# Patient Record
Sex: Male | Born: 1980 | Race: Black or African American | Hispanic: No | Marital: Single | Smoking: Never smoker
Health system: Southern US, Community
[De-identification: ages and names within clinical notes are randomized; demographics above are authoritative.]

## PROBLEM LIST (undated history)

## (undated) DIAGNOSIS — R7303 Prediabetes: Secondary | ICD-10-CM

## (undated) DIAGNOSIS — I10 Essential (primary) hypertension: Secondary | ICD-10-CM

## (undated) DIAGNOSIS — L910 Hypertrophic scar: Secondary | ICD-10-CM

## (undated) DIAGNOSIS — E119 Type 2 diabetes mellitus without complications: Secondary | ICD-10-CM

## (undated) HISTORY — PX: KELOID EXCISION: SHX1856

---
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
## 2003-03-31 ENCOUNTER — Emergency Department (HOSPITAL_COMMUNITY): Admission: EM | Admit: 2003-03-31 | Discharge: 2003-03-31 | Payer: Self-pay | Admitting: Emergency Medicine

## 2003-11-07 ENCOUNTER — Emergency Department (HOSPITAL_COMMUNITY): Admission: EM | Admit: 2003-11-07 | Discharge: 2003-11-07 | Payer: Self-pay | Admitting: Emergency Medicine

## 2012-02-22 ENCOUNTER — Emergency Department (HOSPITAL_COMMUNITY): Payer: Self-pay

## 2012-02-22 ENCOUNTER — Encounter (HOSPITAL_COMMUNITY): Payer: Self-pay | Admitting: *Deleted

## 2012-02-22 ENCOUNTER — Emergency Department (HOSPITAL_COMMUNITY)
Admission: EM | Admit: 2012-02-22 | Discharge: 2012-02-22 | Disposition: A | Discharge status: Home / Self Care | Payer: Self-pay | Attending: Emergency Medicine | Admitting: Emergency Medicine

## 2012-02-22 DIAGNOSIS — H00019 Hordeolum externum unspecified eye, unspecified eyelid: Secondary | ICD-10-CM | POA: Insufficient documentation

## 2012-02-22 DIAGNOSIS — M549 Dorsalgia, unspecified: Secondary | ICD-10-CM | POA: Insufficient documentation

## 2012-02-22 LAB — URINALYSIS, ROUTINE W REFLEX MICROSCOPIC
Bilirubin Urine: NEGATIVE
Glucose, UA: NEGATIVE mg/dL
Hgb urine dipstick: NEGATIVE
Ketones, ur: NEGATIVE mg/dL
Leukocytes, UA: NEGATIVE
Nitrite: NEGATIVE
Protein, ur: NEGATIVE mg/dL
Specific Gravity, Urine: 1.016 (ref 1.005–1.030)
Urobilinogen, UA: 1 mg/dL (ref 0.0–1.0)
pH: 6.5 (ref 5.0–8.0)

## 2012-02-22 MED ORDER — DEXAMETHASONE SODIUM PHOSPHATE 10 MG/ML IJ SOLN
10.0000 mg | Freq: Once | INTRAMUSCULAR | Status: AC
  Administered 2012-02-22: 10 mg via INTRAMUSCULAR
  Filled 2012-02-22: qty 1

## 2012-02-22 MED ORDER — ERYTHROMYCIN 5 MG/GM OP OINT: TOPICAL_OINTMENT | OPHTHALMIC | Status: AC

## 2012-02-22 MED ORDER — HYDROCODONE-ACETAMINOPHEN 5-500 MG PO TABS: 1.0000 | ORAL_TABLET | Freq: Four times a day (QID) | ORAL | Status: AC | PRN

## 2012-02-22 MED ORDER — KETOROLAC TROMETHAMINE 60 MG/2ML IM SOLN
60.0000 mg | Freq: Once | INTRAMUSCULAR | Status: AC
  Administered 2012-02-22: 60 mg via INTRAMUSCULAR
  Filled 2012-02-22: qty 2

## 2012-02-22 MED ORDER — NAPROXEN 500 MG PO TABS: 500.0000 mg | ORAL_TABLET | Freq: Two times a day (BID) | ORAL | Status: AC

## 2012-02-22 NOTE — ED Notes (Signed)
Patient with lower back pain for last few days.  Denies any injury to area.  New job with lifting responsibilities.

## 2012-02-22 NOTE — ED Provider Notes (Signed)
History     CSN: [id number]  Arrival date & time 02/22/12  0153   First MD Initiated Contact with Patient 02/22/12 [phone]      Chief Complaint  Patient presents with  . Back Pain    (Consider location/radiation/quality/duration/timing/severity/associated sxs/prior treatment) HPI Comments: 31 year old male presents with low back pain which radiates into the bilateral hips. He states that this is gradual in onset several days ago, persistent, seems to get better when he stands up and walks and worse when he lays down. He denies any numbness or weakness of the lower extremities, no peroneal numbness, no dysuria retention or incontinence, no fevers vomiting history of IV drug use, history of cancer. He denies any history of back pain similar to this. He does do some lifting and bending at work but has been able to work for the last few days. He also denies being a diabetic, is on no home medications.  Patient is a 31 y.o. male presenting with back pain. The history is provided by the patient.  Back Pain  Pertinent negatives include no fever, no numbness and no weakness.    History reviewed. No pertinent past medical history.  History reviewed. No pertinent past surgical history.  No family history on file.  History  Substance Use Topics  . Smoking status: Not on file  . Smokeless tobacco: Not on file  . Alcohol Use: No      Review of Systems  Constitutional: Negative for fever and chills.  HENT: Negative for neck pain.   Cardiovascular: Negative for leg swelling.  Gastrointestinal: Negative for nausea and vomiting.       No incontinence of bowel  Genitourinary: Negative for difficulty urinating.       No incontinence or retention  Musculoskeletal: Positive for back pain.  Skin: Negative for rash.  Neurological: Negative for weakness and numbness.    Allergies  Review of patient's allergies indicates no known allergies.  Home Medications   Current Outpatient Rx  Name  Route Sig Dispense Refill  . IBUPROFEN 200 MG PO TABS Oral Take 400 mg by mouth every 6 (six) hours as needed. pain    . ERYTHROMYCIN 5 MG/GM OP OINT  Place a 1/2 inch ribbon of ointment into the lower eyelid every 6 hours X 1 week 1 g 1  . HYDROCODONE-ACETAMINOPHEN 5-500 MG PO TABS Oral Take 1-2 tablets by mouth every 6 (six) hours as needed for pain. 15 tablet 0  . NAPROXEN 500 MG PO TABS Oral Take 1 tablet (500 mg total) by mouth 2 (two) times daily with a meal. 30 tablet 0    BP 142/88  Pulse 62  Temp(Src) 98.1 F (36.7 C) (Oral)  Resp 18  SpO2 98%  Physical Exam  Nursing note and vitals reviewed. Constitutional: He appears well-developed and well-nourished. No distress.  HENT:  Head: Normocephalic and atraumatic.  Mouth/Throat: Oropharynx is clear and moist. No oropharyngeal exudate.  Eyes: Conjunctivae and EOM are normal. Pupils are equal, round, and reactive to light. Right eye exhibits no discharge. Left eye exhibits no discharge. No scleral icterus.       Left lower lid with a sty, non-draining, minimal redness  Neck: Normal range of motion. Neck supple. No JVD present. No thyromegaly present.  Cardiovascular: Normal rate, regular rhythm, normal heart sounds and intact distal pulses.  Exam reveals no gallop and no friction rub.   No murmur heard. Pulmonary/Chest: Effort normal and breath sounds normal. No respiratory distress. He has no  wheezes. He has no rales.  Abdominal: Soft. Bowel sounds are normal. He exhibits no distension and no mass. There is no tenderness.  Musculoskeletal: Normal range of motion. He exhibits tenderness ( Tenderness to the midline lumbar spine, no paraspinal tenderness). He exhibits no edema.  Lymphadenopathy:    He has no cervical adenopathy.  Neurological: He is alert. Coordination normal.       Normal gait, normal sensation and motor to light touch and pinprick of the bilateral lower extremities, has positive straight leg raise test bilaterally.  Pain relieved with flexion of the hip and knee, normal patellar reflexes bilaterally. Normal perineal sensation  Skin: Skin is warm and dry. No rash noted. No erythema.  Psychiatric: He has a normal mood and affect. His behavior is normal.    ED Course  Procedures (including critical care time)   Labs Reviewed  URINALYSIS, ROUTINE W REFLEX MICROSCOPIC   Dg Lumbar Spine Complete  02/22/2012  *RADIOLOGY REPORT*  Clinical Data: Chronic back pain radiating to both legs.  No known injury.  LUMBAR SPINE - COMPLETE 4+ VIEW  Comparison: None.  Findings: Five lumbar type vertebrae.  Normal alignment of the lumbar vertebrae and facet joints.  No vertebral compression deformities.  Intervertebral disc space heights are preserved.  No focal bone lesion or bone destruction.  Bone cortex and trabecular architecture appear intact.  IMPRESSION: No displaced fractures identified.  Original Report Authenticated By: Marlon Pel, M.D.     1. Back pain   2. Stye       MDM  Neurologic exam reassuring, has tenderness in the lumbar spine, has no lesion into the legs beyond the upper anterior thighs, lumbar spine x-rays appear normal, pain medications including Toradol and Decadron ordered. Doubt neurologic emergency including epidural hematoma or abscess, mass or fracture.  Patient has negative x-ray, improved with Toradol and Decadron intramuscular, sty of the left eye treated with erythromycin ointment as an outpatient, resource list given for followup.  Discharge Prescriptions include:  #1 Naprosyn #2 hydrocodone #3 erythromycin ophthalmic ointment       Vida Roller, MD 02/22/12 [phone]

## 2012-02-22 NOTE — ED Notes (Signed)
Pt is here for pain in lower back and hips which radiates into his legs.  Not associated with trauma but notes that he is required to do some lifting for his job.  No incontinence with this

## 2012-02-22 NOTE — Discharge Instructions (Signed)
Take Naprosyn twice a day, hydrocodone for severe pain, use the erythromycin ointment for your eye as prescribed. See the list below for followup phone numbers  Your back pain should be treated with medicines such as ibuprofen or aleve and this back pain should get better over the next 2 weeks.  However if you develop severe or worsening pain, low back pain with fever, numbness, weakness or inability to walk or urinate, you should return to the ER immediately.  Please follow up with your doctor this week for a recheck if still having symptoms.  Your x-ray was negative for fractures or dislocations of her spine  RESOURCE GUIDE  Dental Problems  Patients with Medicaid: [facility] [phone]                                           [phone] [address] [employer] [address] Phone:  [phone]                                                  Phone:  [phone]  If unable to pay or uninsured, contact:  [facility] or [facility]. to become qualified for the adult dental clinic.  Chronic Pain Problems Contact [facility]  [phone] Patients need to be referred by their primary care doctor.  Insufficient Money for Medicine Contact [employer]:  call "211" or [facility] 5100614106.  No Primary Care Doctor Call [facility]  [phone] Other agencies that provide inexpensive medical care    [facility]  [phone]    [facility]  [phone]    [facility]  [phone]    [facility]  [phone]    [facility]  [phone]    [facility]  [phone]  Psychological Services [facility]  [phone] [facility] Services  [phone] [facility]   [phone] (emergency services [phone])  Substance Abuse Resources [facility]  3802168638 [facility] [phone] [facility] [phone] Floydene Flock  [phone] Residential & Outpatient Substance Abuse Program  [phone]  Abuse/Neglect [facility] Child Abuse Hotline (567)414-5187 [facility] Child Abuse Hotline (320)520-8377 (After Hours)  Emergency Shelter [facility] 367-831-1817  Maternity Homes Room at the [place] [facility] [place] [phone] [phone] [facility] 6234565892  MRSA Hotline #:   534-136-1188    [facility] Resources  [facility]     [employer]                          [facility] Dept. [address] [place]                       [address]      [address]  [address]  Sela Hua Phone:  [phone]                                   Phone:  [phone]                 Phone:  [place] Phone:  [place] [phone] (After Hours)

## 2014-01-10 ENCOUNTER — Ambulatory Visit (INDEPENDENT_AMBULATORY_CARE_PROVIDER_SITE_OTHER): Payer: No Typology Code available for payment source | Admitting: Family Medicine

## 2014-01-10 VITALS — BP 120/80 | HR 70 | Temp 97.6°F | Resp 16 | Ht 69.0 in | Wt 259.0 lb

## 2014-01-10 DIAGNOSIS — R1310 Dysphagia, unspecified: Secondary | ICD-10-CM

## 2014-01-10 DIAGNOSIS — L910 Hypertrophic scar: Secondary | ICD-10-CM

## 2014-01-10 MED ORDER — OMEPRAZOLE 20 MG PO CPDR: 20.0000 mg | DELAYED_RELEASE_CAPSULE | Freq: Every day | ORAL | Status: DC

## 2014-01-10 NOTE — Progress Notes (Signed)
   Subjective:    Patient ID: Paul Frey, male    DOB: [birth year], 33 y.o.   MRN: [id number]  HPI Has had the sensation of food being stuck in his throat for 1 month. This is increasing in frequency. Feels like there is always something stuck inside the left side of his throat, this feels worse with eating and drinking. Liquids becoming more problematic, irritating area and causing gagging. Food does not get "stuck" but irritates area of throat. Has vomited phlegm during an episode following eating, but has never vomited food due to this. Had coughing episode last night, EMS was called. Was told his blood pressure was elevated. No weight loss. Feels afraid to eat. Has tried eating soft foods. No recent or past heart burn or acid reflux.  Has two keloids on left neck/jaw area that have been removed about 15 years ago and grew back. He is interested in having this removed again. No surgeon preference.  Does not have a primary care provider. [facility].    Review of Systems No chest pain, no fever, no shortness of breath. No headache, neck pain or facial pain. No constipation or diarrhea.    Objective:   Physical Exam  Vitals reviewed. Constitutional: He is oriented to person, place, and time. He appears well-developed and well-nourished. No distress.  HENT:  Head: Normocephalic and atraumatic.  Right Ear: External ear normal.  Left Ear: External ear normal.  Mouth/Throat: Oropharynx is clear and moist.  Eyes: Conjunctivae are normal.  Neck: Normal range of motion. Neck supple.  Cardiovascular: Normal rate, regular rhythm and normal heart sounds.   Pulmonary/Chest: Effort normal and breath sounds normal.  Lymphadenopathy:    He has no cervical adenopathy.  Neurological: He is alert and oriented to person, place, and time.  Skin: Skin is warm and dry. He is not diaphoretic.  Two large 3-4 cm keloids on left lateral lower jaw.  Psychiatric: He  has a normal mood and affect. His behavior is normal. Judgment and thought content normal.   Patient was periodically swallowing deliberately and occasionally coughing.    Assessment & Plan:  1. Swallowing difficulty - DG Esophagus; Future - omeprazole (PRILOSEC) 20 MG capsule; Take 1 capsule (20 mg total) by mouth daily.  Dispense: 30 capsule; Refill: 3 -Encouraged him to start prilosec tonight, can take OTC Zantac for more immediate action -If worsening symptoms, encouraged patient to go to ER 2. Keloid of skin - Ambulatory referral to General Surgery  3- [facility]  Emi Belfasteborah B. Maurio Baize, FNP-BC  [facility]  01/10/2014 6:11 PM

## 2014-01-10 NOTE — Progress Notes (Signed)
I have discussed this case with Ms. Gessner, NP and agree.  

## 2014-01-13 ENCOUNTER — Encounter (HOSPITAL_COMMUNITY): Payer: Self-pay | Admitting: Emergency Medicine

## 2014-01-13 ENCOUNTER — Emergency Department (HOSPITAL_COMMUNITY)
Admission: EM | Admit: 2014-01-13 | Discharge: 2014-01-13 | Disposition: A | Discharge status: Home / Self Care | Payer: 59 | Attending: Emergency Medicine | Admitting: Emergency Medicine

## 2014-01-13 DIAGNOSIS — R070 Pain in throat: Secondary | ICD-10-CM | POA: Insufficient documentation

## 2014-01-13 DIAGNOSIS — J3489 Other specified disorders of nose and nasal sinuses: Secondary | ICD-10-CM | POA: Insufficient documentation

## 2014-01-13 DIAGNOSIS — R6889 Other general symptoms and signs: Secondary | ICD-10-CM | POA: Insufficient documentation

## 2014-01-13 DIAGNOSIS — R1310 Dysphagia, unspecified: Secondary | ICD-10-CM | POA: Insufficient documentation

## 2014-01-13 MED ORDER — MAGIC MOUTHWASH W/LIDOCAINE: 5.0000 mL | Freq: Four times a day (QID) | ORAL | Status: DC | PRN

## 2014-01-13 MED ORDER — GI COCKTAIL ~~LOC~~
30.0000 mL | Freq: Once | ORAL | Status: AC
  Administered 2014-01-13: 30 mL via ORAL
  Filled 2014-01-13: qty 30

## 2014-01-13 NOTE — ED Provider Notes (Signed)
CSN: [id number]     Arrival date & time 01/13/14  1848 History   First MD Initiated Contact with Patient 01/13/14 2038     Chief Complaint  Patient presents with  . Sore Throat     (Consider location/radiation/quality/duration/timing/severity/associated sxs/prior Treatment) HPI History provided by pt.   For the past week, patient has had the sensation of a ball of mucous deep in his throat that causes him to choke ~5-10 minutes after taking anything by mouth, particularly carbonated fluids.  Associated w/ constant lower throat pain and mild sinus pressure.  Denies fever, nasal congestion, rhinorrhea, CP, SOB and cough.  Has never had these sx in the past.  No known h/o acid reflux but he is suspicious that this is the etiology of his sx.    History reviewed. No pertinent past medical history. History reviewed. No pertinent past surgical history. No family history on file. History  Substance Use Topics  . Smoking status: Never Smoker   . Smokeless tobacco: Not on file  . Alcohol Use: No    Review of Systems  All other systems reviewed and are negative.      Allergies  Review of patient's allergies indicates no known allergies.  Home Medications  No current outpatient prescriptions on file. BP 117/87  Pulse 77  Temp(Src) 98.8 F (37.1 C) (Oral)  Resp 14  Ht 5\' 9"  (1.753 m)  Wt 249 lb (112.946 kg)  BMI 36.75 kg/m2  SpO2 97% Physical Exam  Nursing note and vitals reviewed. Constitutional: He is oriented to person, place, and time. He appears well-developed and well-nourished. No distress.  HENT:  Head: Normocephalic and atraumatic.  Eyes:  Normal appearance  Neck: Normal range of motion. Neck supple. No tracheal deviation present. No thyromegaly present.  Tenderness just L of trachea.  No masses. Able to swallow.   Cardiovascular: Normal rate and regular rhythm.   Pulmonary/Chest: Effort normal and breath sounds normal. No respiratory distress.  Musculoskeletal:  Normal range of motion.  Lymphadenopathy:    He has no cervical adenopathy.  Neurological: He is alert and oriented to person, place, and time.  Skin: Skin is warm and dry. No rash noted.  Psychiatric: He has a normal mood and affect. His behavior is normal.    ED Course  Procedures (including critical care time) Labs Review Labs Reviewed - No data to display Imaging Review No results found.   EKG Interpretation None      MDM   Final diagnoses:  Dysphagia  Odynophagia    Healthy 33yo M presents w/ throat pain and post-prandial choking x 1 week.  Choking occurs ~5-10 min following po intake, and is worst w/ carbonated fluids.  Normal neck exam w/ exception of tenderness just L of trachea.  GI cocktail ordered for pain.  I have some suspicion for acid reflux.  Will reassess shortly.  9:08 PM   Pain much improved w/ GI cocktail.  Pt drank a can of sprite w/out difficulty.  I recommended a two week trial of protonix and prescribed prn magic mouthwash as well.  Also recommended that he try sudafed in case he has post-nasal drip.  Advised return for worsening sx and referred to ENT for persistent sx.   Otilio MiuCatherine E Bertis Hustead, PA-C 01/14/14 2034

## 2014-01-13 NOTE — ED Notes (Signed)
Patient presents stating that since Thursday he has felt like there has been something stuck in his epigastric area.  States he can swallow and it takes about 5 minutes for him to get everything down.  States he has tried some carbonated soda and it makes him feels like he is going to immediately spit it out.

## 2014-01-13 NOTE — ED Notes (Signed)
Pt. reports sore throat / swelling " hard to swallow " with tenacious mucus for several days , airway intact / respirations unlabored .

## 2014-01-13 NOTE — ED Notes (Signed)
Throat does not appear to be swollen

## 2014-01-13 NOTE — Discharge Instructions (Signed)
Take prilosec (omeprazole) once a day for the next two weeks.  You can use the magic mouthwash as needed for throat pain.  You can try sudafed as well.  Stick to soft fluids and minimize carbonated beverages. Please return to the ER if your symptoms worsen or you have not had any improvement in 3-5 days.

## 2014-01-15 ENCOUNTER — Telehealth: Payer: Self-pay | Admitting: General Practice

## 2014-01-15 NOTE — Telephone Encounter (Signed)
Called and left vm for patient to give us a call bk to establish primary care/ CPE With any provider that's accepting new patients

## 2014-01-17 NOTE — ED Provider Notes (Signed)
Medical screening examination/treatment/procedure(s) were performed by non-physician practitioner and as supervising physician I was immediately available for consultation/collaboration.   EKG Interpretation None        Junius ArgyleForrest S Drago Hammonds, MD 01/17/14 1218

## 2014-03-07 ENCOUNTER — Encounter (HOSPITAL_COMMUNITY): Payer: Self-pay | Admitting: Emergency Medicine

## 2014-03-07 ENCOUNTER — Emergency Department (HOSPITAL_COMMUNITY)
Admission: EM | Admit: 2014-03-07 | Discharge: 2014-03-07 | Disposition: A | Discharge status: Home / Self Care | Payer: 59 | Attending: Emergency Medicine | Admitting: Emergency Medicine

## 2014-03-07 ENCOUNTER — Emergency Department (HOSPITAL_COMMUNITY): Payer: 59

## 2014-03-07 DIAGNOSIS — X500XXA Overexertion from strenuous movement or load, initial encounter: Secondary | ICD-10-CM | POA: Insufficient documentation

## 2014-03-07 DIAGNOSIS — Y9389 Activity, other specified: Secondary | ICD-10-CM | POA: Insufficient documentation

## 2014-03-07 DIAGNOSIS — Y9289 Other specified places as the place of occurrence of the external cause: Secondary | ICD-10-CM | POA: Insufficient documentation

## 2014-03-07 DIAGNOSIS — H00019 Hordeolum externum unspecified eye, unspecified eyelid: Secondary | ICD-10-CM | POA: Insufficient documentation

## 2014-03-07 DIAGNOSIS — S335XXA Sprain of ligaments of lumbar spine, initial encounter: Secondary | ICD-10-CM | POA: Insufficient documentation

## 2014-03-07 DIAGNOSIS — S39012A Strain of muscle, fascia and tendon of lower back, initial encounter: Secondary | ICD-10-CM

## 2014-03-07 DIAGNOSIS — M545 Low back pain, unspecified: Secondary | ICD-10-CM

## 2014-03-07 MED ORDER — NAPROXEN 500 MG PO TABS: 500.0000 mg | ORAL_TABLET | Freq: Two times a day (BID) | ORAL | Status: DC

## 2014-03-07 MED ORDER — HYDROMORPHONE HCL PF 1 MG/ML IJ SOLN
1.0000 mg | Freq: Once | INTRAMUSCULAR | Status: AC
  Administered 2014-03-07: 1 mg via INTRAVENOUS
  Filled 2014-03-07: qty 1

## 2014-03-07 MED ORDER — DIAZEPAM 5 MG/ML IJ SOLN
5.0000 mg | Freq: Once | INTRAMUSCULAR | Status: AC
  Administered 2014-03-07: 5 mg via INTRAMUSCULAR
  Filled 2014-03-07: qty 2

## 2014-03-07 MED ORDER — METHOCARBAMOL 500 MG PO TABS: 500.0000 mg | ORAL_TABLET | Freq: Two times a day (BID) | ORAL | Status: DC

## 2014-03-07 MED ORDER — HYDROCODONE-ACETAMINOPHEN 5-325 MG PO TABS: 1.0000 | ORAL_TABLET | ORAL | Status: DC | PRN

## 2014-03-07 NOTE — ED Notes (Signed)
Patient transported to X-ray 

## 2014-03-07 NOTE — ED Notes (Signed)
Pt reports lower back pain x 3 days. States "I have been lifting weights and my back has been hurting." Pt denies urinary/bowel incontinence. Pt is ambulatory but with difficulty dt pain.

## 2014-03-07 NOTE — ED Provider Notes (Signed)
CSN: [id number]     Arrival date & time 03/07/14  1824 This chart was scribed for non-physician practitioner, Dierdre ForthHannah Acheron Sugg, PA-C, working with Doug SouSam Jacubowitz, MD by Charline BillsEssence Howell, ED Scribe. This patient was seen in room TR07C/TR07C and the patient's care was started at 7:22 PM.    Chief Complaint  Patient presents with  . Back Pain   The history is provided by the patient and medical records. No language interpreter was used.   HPI Comments: Paul Frey is a 33 y.o. male who presents to the Emergency Department complaining of sharp lower back pain that radiates to bilateral legs onset 03/05/14. Pt states that he fell asleep in his car 2 days ago and noted pain upon waking. He initially thought the pain was due to the position he slept in. Pt also states that he went to the bathroom this morning, heard a "pop" and was unable to stand due to severity of pain afterwards, but had no numbness or tingling in his legs at that time. He rates his pain 9/10 with movement.  He denies numbness in his legs, feet or groin, he also denies bowel or bladder incontinence. Pain is worsened with walking and sitting back and eased with sitting up and laying on his side. He denies fall or injury. He has taken Advil with no relief. Pt was seen here before for similar episode. No known back problems. No h/o back surgery.  History reviewed. No pertinent past medical history. History reviewed. No pertinent past surgical history. History reviewed. No pertinent family history. History  Substance Use Topics  . Smoking status: Never Smoker   . Smokeless tobacco: Not on file  . Alcohol Use: No    Review of Systems  Constitutional: Negative for fever and fatigue.  Respiratory: Negative for chest tightness and shortness of breath.   Cardiovascular: Negative for chest pain.  Gastrointestinal: Negative for nausea, vomiting, abdominal pain and diarrhea.  Genitourinary: Negative for dysuria, urgency, frequency and  hematuria.  Musculoskeletal: Positive for back pain and gait problem ( 2/2 pain). Negative for joint swelling, neck pain and neck stiffness.  Skin: Negative for rash.  Neurological: Negative for weakness, light-headedness, numbness and headaches.  All other systems reviewed and are negative.   Allergies  Review of patient's allergies indicates no known allergies.  Home Medications   Prior to Admission medications   Medication Sig Start Date End Date Taking? Authorizing Provider  Alum & Mag Hydroxide-Simeth (MAGIC MOUTHWASH W/LIDOCAINE) SOLN Take 5 mLs by mouth 4 (four) times daily as needed for mouth pain. 01/13/14   Arie Sabinaatherine E Schinlever, PA-C   Triage Vitals: BP 138/88  Pulse 68  Temp(Src) 98 F (36.7 C) (Oral)  Resp 18  SpO2 100% Physical Exam  Nursing note and vitals reviewed. Constitutional: He is oriented to person, place, and time. He appears well-developed and well-nourished. No distress.  HENT:  Head: Normocephalic and atraumatic.  Mouth/Throat: Oropharynx is clear and moist. No oropharyngeal exudate.  Eyes: Conjunctivae are normal.  Left lower eye lid with external hordeolum - pt reports this is chronic and has been present for many years  Neck: Normal range of motion. Neck supple.  Full ROM without pain  Cardiovascular: Normal rate, regular rhythm, normal heart sounds and intact distal pulses.   No murmur heard. Pulmonary/Chest: Effort normal and breath sounds normal. No respiratory distress. He has no wheezes.  Abdominal: Soft. Bowel sounds are normal. He exhibits no distension. There is no tenderness.  No masses or tenderness  Musculoskeletal:  Decreased range of motion of the L-spine with normal range of motion of the T-spine No tenderness to palpation of the spinous processes of the T-spine or L-spine Mild tenderness to palpation of the bilateral paraspinous muscles of the L-spine No midline tenderness to palpation, but pt indicates this is the source of his  pain Positive bilateral straight leg raise  Lymphadenopathy:    He has no cervical adenopathy.  Neurological: He is alert and oriented to person, place, and time. He has normal reflexes. He exhibits normal muscle tone. Coordination normal.  Speech is clear and goal oriented, follows commands Normal 5/5 strength in upper and lower extremities bilaterally including dorsiflexion and plantar flexion, strong and equal grip strength Sensation normal to light and sharp touch Moves extremities without ataxia, coordination intact Patient unwilling to ambulate due to pain  Skin: Skin is warm and dry. No rash noted. He is not diaphoretic. No erythema.  Psychiatric: He has a normal mood and affect. His behavior is normal.    ED Course  Procedures (including critical care time) DIAGNOSTIC STUDIES: Oxygen Saturation is 100% on RA, normal by my interpretation.    COORDINATION OF CARE: 7:27 PM Discussed treatment plan with pt at bedside and pt agreed to plan.  Labs Review  Labs Reviewed - No data to display  Imaging Review Dg Lumbar Spine Complete  03/07/2014   CLINICAL DATA:  Popping sensation and severe low back pain.  EXAM: LUMBAR SPINE - COMPLETE 4+ VIEW  COMPARISON:  DG LUMBAR SPINE COMPLETE dated 02/22/2012  FINDINGS: Interval progression of L4-L5 degenerative disease with slightly more disc space loss. Grade I retrolisthesis of L4 on L5 measures 4 mm, little change from prior. Mild disc space loss is also present at L3-L4. The L5-S1 disc appears normal. Mild levoconvex curve may be positional or due to spasm.  IMPRESSION: Mild progression of L4-L5 degenerative disc disease with grade I retrolisthesis.   Electronically Signed   By: Andreas Newport M.D.   On: 03/07/2014 20:49     EKG Interpretation None      No red flag s/s of low back pain. Patient was counseled on back pain precautions and told to do activity as tolerated but do not lift, push, or pull heavy objects more than 10 pounds for  the next week. Patient counseled to use ice or heat on back for no longer than 15 minutes every hour.   Patient prescribed muscle relaxer and counseled on proper use of muscle relaxant medication.    Patient prescribed narcotic pain medicine and counseled on proper use of narcotic pain medications. Told he can increase to every 4 hrs if needed while pain is worse. Counseled not to combine this medication with others containing tylenol.   Urged patient not to drink alcohol, drive, or perform any other activities that requires focus while taking either of these medications.   Patient urged to follow-up with PCP if pain does not improve with treatment and rest or if pain becomes recurrent. Urged to return with worsening severe pain, loss of bowel or bladder control, trouble walking.   The patient verbalizes understanding and agrees with the plan.   MDM   Final diagnoses:  Low back pain  Lumbar strain  Hordeolum externum (stye)   Paul Frey presents with nontraumatic back pain.  Patient with back pain.  No neurological deficits and normal neuro exam.  Pt reports midline pain though this is not elicited on exam.  Will x-ray and give pain  control.    8:29 PM Pt pain improved with medications. He ambulates without difficulty and completes the neuro exam with some pain but without focal deficit or red flag for back pain.    Patient can walk but states is painful.  No loss of bowel or bladder control.  No concern for cauda equina.  No fever, night sweats, weight loss, h/o cancer, IVDU.  RICE protocol and pain medicine indicated and discussed with patient.  Patient is going to use the resource guide to find a primary care physician for which he will followup in one week.  He's been told to return to the emergency department for gait disturbance, loss of bowel or bladder control or other concerning symptoms.  It has been determined that no acute conditions requiring further emergency intervention  are present at this time. The patient/guardian have been advised of the diagnosis and plan. We have discussed signs and symptoms that warrant return to the ED, such as changes or worsening in symptoms.   Vital signs are stable at discharge.   BP 138/88  Pulse 68  Temp(Src) 98 F (36.7 C) (Oral)  Resp 18  SpO2 100%  Patient/guardian has voiced understanding and agreed to follow-up with the PCP or specialist.  I personally performed the services described in this documentation, which was scribed in my presence. The recorded information has been reviewed and is accurate.    Dahlia ClientHannah Reagen Goates, PA-C 03/07/14 2135

## 2014-03-07 NOTE — Discharge Instructions (Signed)
1. Medications: robaxin, naproxyn, vicodin, usual home medications 2. Treatment: rest, drink plenty of fluids, gentle stretching as discussed, alternate ice and heat 3. Follow Up: Please followup with your primary doctor for discussion of your diagnoses and further evaluation after today's visit; if you do not have a primary care doctor use the resource guide provided to find one;    Back Exercises Back exercises help treat and prevent back injuries. The goal of back exercises is to increase the strength of your abdominal and back muscles and the flexibility of your back. These exercises should be started when you no longer have back pain. Back exercises include:  Pelvic Tilt. Lie on your back with your knees bent. Tilt your pelvis until the lower part of your back is against the floor. Hold this position 5 to 10 sec and repeat 5 to 10 times.  Knee to Chest. Pull first 1 knee up against your chest and hold for 20 to 30 seconds, repeat this with the other knee, and then both knees. This may be done with the other leg straight or bent, whichever feels better.  Sit-Ups or Curl-Ups. Bend your knees 90 degrees. Start with tilting your pelvis, and do a partial, slow sit-up, lifting your trunk only 30 to 45 degrees off the floor. Take at least 2 to 3 seconds for each sit-up. Do not do sit-ups with your knees out straight. If partial sit-ups are difficult, simply do the above but with only tightening your abdominal muscles and holding it as directed.  Hip-Lift. Lie on your back with your knees flexed 90 degrees. Push down with your feet and shoulders as you raise your hips a couple inches off the floor; hold for 10 seconds, repeat 5 to 10 times.  Back arches. Lie on your stomach, propping yourself up on bent elbows. Slowly press on your hands, causing an arch in your low back. Repeat 3 to 5 times. Any initial stiffness and discomfort should lessen with repetition over time.  Shoulder-Lifts. Lie face down  with arms beside your body. Keep hips and torso pressed to floor as you slowly lift your head and shoulders off the floor. Do not overdo your exercises, especially in the beginning. Exercises may cause you some mild back discomfort which lasts for a few minutes; however, if the pain is more severe, or lasts for more than 15 minutes, do not continue exercises until you see your caregiver. Improvement with exercise therapy for back problems is slow.  See your caregivers for assistance with developing a proper back exercise program. Document Released: 11/24/2004 Document Revised: 01/09/2012 Document Reviewed: 08/18/2011 [facility] Patient Information 2014 [place], [place].    Emergency Department Resource Guide 1) Find a Doctor and Pay Out of Pocket Although you won't have to find out who is covered by your insurance plan, it is a good idea to ask around and get recommendations. You will then need to call the office and see if the doctor you have chosen will accept you as a new patient and what types of options they offer for patients who are self-pay. Some doctors offer discounts or will set up payment plans for their patients who do not have insurance, but you will need to ask so you aren't surprised when you get to your appointment.  2) Contact Your Local Health Department Not all health departments have doctors that can see patients for sick visits, but many do, so it is worth a call to see if yours does. If you  don't know where your local health department is, you can check in your phone book. The CDC also has a tool to help you locate your state's health department, and many state websites also have listings of all of their local health departments.  3) Find a [facility] If your illness is not likely to be very severe or complicated, you may want to try a walk in clinic. These are popping up all over the country in pharmacies, drugstores, and shopping centers. They're usually staffed by nurse  practitioners or physician assistants that have been trained to treat common illnesses and complaints. They're usually fairly quick and inexpensive. However, if you have serious medical issues or chronic medical problems, these are probably not your best option.  No Primary Care Doctor: - Call Health Connect at  [phone] - they can help you locate a primary care doctor that  accepts your insurance, provides certain services, etc. - Physician Referral Service- [phone]  Chronic Pain Problems: Organization         Address  Phone   Notes  [facility]  [phone] Patients need to be referred by their primary care doctor.   Medication Assistance: Organization         Address  Phone   Notes  [facility] Medication [facility] [address] [place], [place] [address] [phone] --Must be a resident of [facility] -- Must have NO insurance coverage whatsoever (no Medicaid/ Medicare, etc.) -- The pt. MUST have a primary care doctor that directs their care regularly and follows them in the community   MedAssist  [phone]   [employer]  [phone]    Agencies that provide inexpensive medical care: Organization         Address  Phone   Notes  [place]  [phone]   [facility]    [phone]   [facility] [place], [place] [address] [phone]   [place] [address] [place] [phone]   Planned Parenthood    ([phone]   [facility]    [phone]   [place] and [place], [place] Phone:  [phone], Fax:  [phone] Hours of Operation:  9 am - 6 pm, M-F.  Also accepts Medicaid/Medicare and self-pay.  [facility] for [place], [address], [place] Phone: [phone], Fax: [phone]. Hours of Operation:  8:30 am -  5:30 pm, M-F.  Also accepts Medicaid and self-pay.  [facility] [place] [address], [place] Phone: [phone]   [place], [place], [place] [phone], Ext. [phone] Mondays & Thursdays: 7-9 AM.  First 15 patients are seen on a first come, first serve basis.    [place] Providers:  Organization         Address  Phone   Notes  [facility] [address], [place] [phone] Also accepts self-pay patients.  [place], [place]  [phone]   [place], [address], [place] [phone]   [facility] [address] [place] [phone]   Lucianne Lei [address], [place]   [phone] Only accepts [place]  Access Medicaid patients after they have their name applied to their card.   Self-Pay (no insurance) in [facility]:  Organization         Address  Phone   Notes  Sickle Cell Patients, [facility] [place] [phone]   [facility] [phone]   [facility] [place]  [place], [place], [place] [phone]   [facility]/Dr. Osei-Bonsu  [address], [place] or [address], [place] [phone] Phone number for both [place] and [place] locations is the same.  Urgent Medical and [facility] [address] [place] [phone]   [facility] [address] [place] or [address] [phone]   [facility] [address] [place] [phone], phone; [phone], fax Sees patients 1st and 3rd Saturday of every month.  Must not qualify for public or private insurance (i.e. Medicaid, Medicare, [place] Health Choice,  Veterans' Benefits)  Household income should be no more than 200% of the poverty level The clinic cannot treat you if you are pregnant or think you are pregnant  Sexually transmitted diseases are not treated at the clinic.    Dental Care: Organization         Address  Phone  Notes  [facility] Department of [facility] [place] [phone] Accepts children up to age 67 who are enrolled in [place] or [place]; pregnant women with a Medicaid card; and children who have applied for Medicaid or  Health Choice, but were declined, whose parents can pay a reduced fee at time of service.  [facility] Department of [facility]  [address], [place] [phone] Accepts children up to age 65 who are enrolled in [place] or [place]; pregnant women with a Medicaid card; and children who have applied for Medicaid or  Health Choice, but were declined, whose parents can pay a reduced fee at time of service.  [place] [facility] Adult Dental Access PROGRAM  [place] [phone] Patients are seen by appointment only. Walk-ins are not accepted. [place] will see patients 43 years of age and older. Monday - Tuesday (8am-5pm) Most Wednesdays (8:30-5pm) $30 per visit, cash only  [facility] Adult Dental Access PROGRAM  [address], [facility] [phone] Patients are seen by appointment only. Walk-ins are not accepted. [place] will see patients 42 years of age and older. One Wednesday Evening (Monthly: Volunteer Based).  $30 per visit, cash only  [place]  [phone] for adults; Children under age 83, call Graduate Pediatric Dentistry at [phone]. Children aged 65-14, please call [phone] to request a pediatric application.  Dental services are provided in all areas of dental care including fillings, crowns and bridges, complete and  partial dentures, implants, gum treatment, root canals, and extractions. Preventive care is also provided. Treatment is provided to both adults and children. Patients are selected via a lottery and there is often a waiting list.   [facility] [address] [place]  [phone] [email or web address], [place], [place] [phone], Ext. 123 Second and Fourth Thursday of each month, opens at 6:30 AM; Clinic ends at 9  AM.  Patients are seen on a first-come first-served basis, and a limited number are seen during each clinic.   [facility]  [address] Hillard Danker [place], [place] [phone]   Eligibility Requirements You must have lived in [place], [place], or [place] counties for at least the last three months.   You cannot be eligible for state or federal sponsored [employer], including [employer], [place], or [employer].   You generally cannot be eligible for healthcare insurance through your employer.    How to apply: Eligibility screenings are held every Tuesday and Wednesday afternoon from 1:00 pm until 4:00 pm. You do not need an appointment for the interview!  [facility] [address] [place], [place]   [place]  [place] Department  [place]  [phone]    Behavioral Health Resources in the Community: Intensive Outpatient Programs Organization         Address  Phone  Notes  [place]. [address], [place], [place] [phone]   [facility] Outpatient [address] [place], [place]   ADS: Alcohol & Drug Svcs [address] [place], [place]   [place] [address],  [place], [place] or [phone]   Substance Abuse Resources Organization          Address  Phone  Notes  Alcohol and Drug Services  [phone]   [place]  [phone]   [facility]   Chinita Pester  [phone]   Residential & Outpatient Substance Abuse Program  [phone]   Psychological Services Organization         Address  Phone  Notes  [facility] [place]  [place]  [phone]   [place] [address], [place] or [phone]    Mobile Crisis Teams Organization         Address  Phone  Notes  [facility], Mobile Crisis Care Unit  [phone]   [facility]ive Psychotherapeutic Services  [address] [place], [place]   [place] [address] [place] [phone]    Self-Help/Support Groups Organization         Address  Phone             Notes  [place]. of [place] - variety of support groups  [place] Call for more information  Narcotics Anonymous (NA), [facility] [address], [employer] [place]  2 meetings at this location   Special educational needs teacher         Address  Phone  Notes  ASAP Residential Treatment [place],    [place]  [phone]   [facility]  [address], [place] [phone], [place], [place]   [place], [place] [phone] Admissions: 8am-3pm M-F  Incentives Substance [place] [address],    [place], [place] [phone]   [facility] [address] Jadene Pierini [place], [place]   [facility] [address],  [place], [place]   [facility] Programs - Intensive Outpatient [address] Kristeen [address], [place], [place]   [facility] ([place].,  [place], [place] or [phone]   Residential Treatment Services (RTS)  [address] [place], [place] Accepts Medicaid  [facility] [place] [address],  [place] [phone] Substance Abuse/Addiction Treatment   [facility] Organization         Address  Phone  Notes  [facility]  [phone]   Domenic Schwab, PhD [address] Arlis Porta [place], [place]   [phone] or [phone]   [place], [place] [phone]   [address], [place], [place] [phone] Insurance/Medicaid/sponsorship through [facility] and Families [address], [address] [place]                                    [place], [place] [phone] [place] [address], [place] [phone]    Dr. Adele Schilder  [phone]   [facility] Dept. 1) [address] [place] 2) [place] 3)  [place] [address], [place] [phone]  [phone]   [place] [phone] or [phone] (After Hours)

## 2014-03-07 NOTE — ED Notes (Signed)
Pt reports lower back pain that started on Wednesday. Reports that he took OTC medication without relief.

## 2014-03-08 NOTE — ED Provider Notes (Signed)
Medical screening examination/treatment/procedure(s) were performed by non-physician practitioner and as supervising physician I was immediately available for consultation/collaboration.   EKG Interpretation None       Georgiana Spillane, MD 03/08/14 0053 

## 2015-01-30 ENCOUNTER — Ambulatory Visit (HOSPITAL_COMMUNITY)
Admission: RE | Admit: 2015-01-30 | Discharge: 2015-01-30 | Disposition: A | Discharge status: Home / Self Care | Payer: PRIVATE HEALTH INSURANCE | Source: Ambulatory Visit | Attending: Family Medicine | Admitting: Family Medicine

## 2015-01-30 DIAGNOSIS — R1310 Dysphagia, unspecified: Secondary | ICD-10-CM | POA: Diagnosis present

## 2015-04-24 ENCOUNTER — Emergency Department (HOSPITAL_COMMUNITY)
Admission: EM | Admit: 2015-04-24 | Discharge: 2015-04-24 | Disposition: A | Discharge status: Home / Self Care | Payer: PRIVATE HEALTH INSURANCE | Attending: Emergency Medicine | Admitting: Emergency Medicine

## 2015-04-24 ENCOUNTER — Encounter (HOSPITAL_COMMUNITY): Payer: Self-pay

## 2015-04-24 DIAGNOSIS — Z791 Long term (current) use of non-steroidal anti-inflammatories (NSAID): Secondary | ICD-10-CM | POA: Diagnosis not present

## 2015-04-24 DIAGNOSIS — Z79899 Other long term (current) drug therapy: Secondary | ICD-10-CM | POA: Diagnosis not present

## 2015-04-24 DIAGNOSIS — L910 Hypertrophic scar: Secondary | ICD-10-CM | POA: Diagnosis not present

## 2015-04-24 HISTORY — DX: Hypertrophic scar: L91.0

## 2015-04-24 NOTE — Discharge Instructions (Signed)
Take your antibiotics as directed until gone.

## 2015-04-24 NOTE — ED Provider Notes (Signed)
CSN: [id number]     Arrival date & time 04/24/15  4098 History   First MD Initiated Contact with Patient 04/24/15 0357     Chief Complaint  Patient presents with  . Keloid     (Consider location/radiation/quality/duration/timing/severity/associated sxs/prior Treatment) HPI Comments: Patient is a 34 year old male who present with a keloid on his left face since 2005. Patient reports over the past 2 days, the area has become painful. The pain is throbbing and severe without radiation. There is associated erythema and edema. Patient reports going to urgent care yesterday where the keloid was drained and he was started on antibiotics. Patient has not started taking the antibiotics. He is concerned that it is still draining.    Past Medical History  Diagnosis Date  . Keloid of skin    Past Surgical History  Procedure Laterality Date  . Keloid excision     No family history on file. History  Substance Use Topics  . Smoking status: Never Smoker   . Smokeless tobacco: Not on file  . Alcohol Use: No    Review of Systems  Constitutional: Negative for fever, chills and fatigue.  HENT: Positive for facial swelling. Negative for trouble swallowing.   Eyes: Negative for visual disturbance.  Respiratory: Negative for shortness of breath.   Cardiovascular: Negative for chest pain and palpitations.  Gastrointestinal: Negative for nausea, vomiting, abdominal pain and diarrhea.  Genitourinary: Negative for dysuria and difficulty urinating.  Musculoskeletal: Negative for arthralgias and neck pain.  Skin: Negative for color change.  Neurological: Negative for dizziness and weakness.  Psychiatric/Behavioral: Negative for dysphoric mood.      Allergies  Review of patient's allergies indicates no known allergies.  Home Medications   Prior to Admission medications   Medication Sig Start Date End Date Taking? Authorizing Provider  HYDROcodone-acetaminophen (NORCO/VICODIN) 5-325 MG per  tablet Take 1-2 tablets by mouth every 4 (four) hours as needed. 03/07/14   Hannah Muthersbaugh, PA-C  methocarbamol (ROBAXIN) 500 MG tablet Take 1 tablet (500 mg total) by mouth 2 (two) times daily. 03/07/14   Hannah Muthersbaugh, PA-C  naproxen (NAPROSYN) 500 MG tablet Take 1 tablet (500 mg total) by mouth 2 (two) times daily with a meal. 03/07/14   Hannah Muthersbaugh, PA-C   BP 162/113 mmHg  Pulse 70  Temp(Src) 98.1 F (36.7 C) (Oral)  Resp 18  Ht  (1.778 m)  Wt 240 lb (108.863 kg)  BMI 34.44 kg/m2  SpO2 98% Physical Exam  Constitutional: He is oriented to person, place, and time. He appears well-developed and well-nourished. No distress.  HENT:  Head: Normocephalic and atraumatic.  Pedunculated keloid of left face that is erythematous and is draining purulent drainage.   Eyes: Conjunctivae and EOM are normal.  Neck: Normal range of motion.  Cardiovascular: Normal rate and regular rhythm.  Exam reveals no gallop and no friction rub.   No murmur heard. Pulmonary/Chest: Effort normal and breath sounds normal. He has no wheezes. He has no rales. He exhibits no tenderness.  Abdominal: Soft. He exhibits no distension. There is no tenderness.  Musculoskeletal: Normal range of motion.  Neurological: He is alert and oriented to person, place, and time. Coordination normal.  Speech is goal-oriented. Moves limbs without ataxia.   Skin: Skin is warm and dry.  Psychiatric: He has a normal mood and affect. His behavior is normal.  Nursing note and vitals reviewed.   ED Course  Procedures (including critical care time) Labs Review Labs Reviewed -  No data to display  Imaging Review No results found.   EKG Interpretation None      MDM   Final diagnoses:  Keloid of skin   4:14 AM Patient instructed to take antibiotics that were prescribed earlier. Vitals stable and patient afebrile.     Emilia Beck, PA-C 04/24/15 0421  Tomasita Crumble, MD 04/24/15 1700

## 2015-04-24 NOTE — ED Notes (Signed)
Pt has has keloid on right side of face since 2005, past two weeks has gotten bigger, has also been draining pus and blood, went to UC yesterday and was drained, pt states skin is not falling off it.

## 2015-07-08 ENCOUNTER — Emergency Department (HOSPITAL_COMMUNITY)
Admission: EM | Admit: 2015-07-08 | Discharge: 2015-07-08 | Disposition: A | Discharge status: Home / Self Care | Payer: PRIVATE HEALTH INSURANCE | Attending: Emergency Medicine | Admitting: Emergency Medicine

## 2015-07-08 ENCOUNTER — Emergency Department (HOSPITAL_COMMUNITY): Payer: PRIVATE HEALTH INSURANCE

## 2015-07-08 ENCOUNTER — Encounter (HOSPITAL_COMMUNITY): Payer: Self-pay | Admitting: Emergency Medicine

## 2015-07-08 DIAGNOSIS — R200 Anesthesia of skin: Secondary | ICD-10-CM | POA: Diagnosis not present

## 2015-07-08 DIAGNOSIS — R079 Chest pain, unspecified: Secondary | ICD-10-CM | POA: Diagnosis present

## 2015-07-08 DIAGNOSIS — R202 Paresthesia of skin: Secondary | ICD-10-CM | POA: Diagnosis not present

## 2015-07-08 DIAGNOSIS — R0789 Other chest pain: Secondary | ICD-10-CM | POA: Diagnosis not present

## 2015-07-08 DIAGNOSIS — Z872 Personal history of diseases of the skin and subcutaneous tissue: Secondary | ICD-10-CM | POA: Insufficient documentation

## 2015-07-08 LAB — BASIC METABOLIC PANEL
Anion gap: 8 (ref 5–15)
BUN: 8 mg/dL (ref 6–20)
CHLORIDE: 101 mmol/L (ref 101–111)
CO2: 29 mmol/L (ref 22–32)
Calcium: 9.3 mg/dL (ref 8.9–10.3)
Creatinine, Ser: 1.24 mg/dL (ref 0.61–1.24)
GFR calc non Af Amer: >60 mL/min (ref >60)
Glucose, Bld: 112 mg/dL — ABNORMAL HIGH (ref 65–99)
POTASSIUM: 3.8 mmol/L (ref 3.5–5.1)
SODIUM: 138 mmol/L (ref 135–145)

## 2015-07-08 LAB — I-STAT TROPONIN, ED: Troponin i, poc: 0 ng/mL (ref 0.00–0.08)

## 2015-07-08 LAB — CBC
HEMATOCRIT: 44.9 % (ref 39.0–52.0)
Hemoglobin: 15.6 g/dL (ref 13.0–17.0)
MCH: 29.5 pg (ref 26.0–34.0)
MCHC: 34.7 g/dL (ref 30.0–36.0)
MCV: 84.9 fL (ref 78.0–100.0)
Platelets: 233 10*3/uL (ref 150–400)
RBC: 5.29 MIL/uL (ref 4.22–5.81)
RDW: 12.4 % (ref 11.5–15.5)
WBC: 9.7 10*3/uL (ref 4.0–10.5)

## 2015-07-08 MED ORDER — KETOROLAC TROMETHAMINE 30 MG/ML IJ SOLN
30.0000 mg | Freq: Once | INTRAMUSCULAR | Status: AC
  Administered 2015-07-08: 30 mg via INTRAVENOUS
  Filled 2015-07-08: qty 1

## 2015-07-08 NOTE — ED Notes (Signed)
The patient said he started having SoB a week ago, but the chest pain and left arm tingling started about three hours ago.  The patient rates 1/10.  The patient denies any other symptoms.  He did take advil for the pain.

## 2015-07-08 NOTE — ED Notes (Signed)
Patient transported to X-ray 

## 2015-07-08 NOTE — Discharge Instructions (Signed)
Chest Pain (Nonspecific) °It is often hard to give a specific diagnosis for the cause of chest pain. There is always a chance that your pain could be related to something serious, such as a heart attack or a blood clot in the lungs. You need to follow up with your health care provider for further evaluation. °CAUSES  °· Heartburn. °· Pneumonia or bronchitis. °· Anxiety or stress. °· Inflammation around your heart (pericarditis) or lung (pleuritis or pleurisy). °· A blood clot in the lung. °· A collapsed lung (pneumothorax). It can develop suddenly on its own (spontaneous pneumothorax) or from trauma to the chest. °· Shingles infection (herpes zoster virus). °The chest wall is composed of bones, muscles, and cartilage. Any of these can be the source of the pain. °· The bones can be bruised by injury. °· The muscles or cartilage can be strained by coughing or overwork. °· The cartilage can be affected by inflammation and become sore (costochondritis). °DIAGNOSIS  °Lab tests or other studies may be needed to find the cause of your pain. Your health care provider may have you take a test called an ambulatory electrocardiogram (ECG). An ECG records your heartbeat patterns over a 24-hour period. You may also have other tests, such as: °· Transthoracic echocardiogram (TTE). During echocardiography, sound waves are used to evaluate how blood flows through your heart. °· Transesophageal echocardiogram (TEE). °· Cardiac monitoring. This allows your health care provider to monitor your heart rate and rhythm in real time. °· Holter monitor. This is a portable device that records your heartbeat and can help diagnose heart arrhythmias. It allows your health care provider to track your heart activity for several days, if needed. °· Stress tests by exercise or by giving medicine that makes the heart beat faster. °TREATMENT  °· Treatment depends on what may be causing your chest pain. Treatment may include: °¨ Acid blockers for  heartburn. °¨ Anti-inflammatory medicine. °¨ Pain medicine for inflammatory conditions. °¨ Antibiotics if an infection is present. °· You may be advised to change lifestyle habits. This includes stopping smoking and avoiding alcohol, caffeine, and chocolate. °· You may be advised to keep your head raised (elevated) when sleeping. This reduces the chance of acid going backward from your stomach into your esophagus. °Most of the time, nonspecific chest pain will improve within 2-3 days with rest and mild pain medicine.  °HOME CARE INSTRUCTIONS  °· If antibiotics were prescribed, take them as directed. Finish them even if you start to feel better. °· For the next few days, avoid physical activities that bring on chest pain. Continue physical activities as directed. °· Do not use any tobacco products, including cigarettes, chewing tobacco, or electronic cigarettes. °· Avoid drinking alcohol. °· Only take medicine as directed by your health care provider. °· Follow your health care provider's suggestions for further testing if your chest pain does not go away. °· Keep any follow-up appointments you made. If you do not go to an appointment, you could develop lasting (chronic) problems with pain. If there is any problem keeping an appointment, call to reschedule. °SEEK MEDICAL CARE IF:  °· Your chest pain does not go away, even after treatment. °· You have a rash with blisters on your chest. °· You have a fever. °SEEK IMMEDIATE MEDICAL CARE IF:  °· You have increased chest pain or pain that spreads to your arm, neck, jaw, back, or abdomen. °· You have shortness of breath. °· You have an increasing cough, or you cough   up blood. °· You have severe back or abdominal pain. °· You feel nauseous or vomit. °· You have severe weakness. °· You faint. °· You have chills. °This is an emergency. Do not wait to see if the pain will go away. Get medical help at once. Call your local emergency services (911 in [place] Do not drive  yourself to the hospital. °MAKE SURE YOU:  °· Understand these instructions. °· Will watch your condition. °· Will get help right away if you are not doing well or get worse. °Document Released: 07/27/2005 Document Revised: 10/22/2013 Document Reviewed: 05/22/2008 °ExitCare® Patient Information ©2015 [employer]. This information is not intended to replace advice given to you by your health care provider. Make sure you discuss any questions you have with your health care provider. °Paresthesia °Paresthesia is an abnormal burning or prickling sensation. This sensation is generally felt in the hands, arms, legs, or feet. However, it may occur in any part of the body. It is usually not painful. The feeling may be described as: °· Tingling or numbness. °· "Pins and needles." °· Skin crawling. °· Buzzing. °· Limbs "falling asleep." °· Itching. °Most people experience temporary (transient) paresthesia at some time in their lives. °CAUSES  °Paresthesia may occur when you breathe too quickly (hyperventilation). It can also occur without any apparent cause. Commonly, paresthesia occurs when pressure is placed on a nerve. The feeling quickly goes away once the pressure is removed. For some people, however, paresthesia is a long-lasting (chronic) condition caused by an underlying disorder. The underlying disorder may be: °· A traumatic, direct injury to nerves. Examples include a: °· Broken (fractured) neck. °· Fractured skull. °· A disorder affecting the brain and spinal cord (central nervous system). Examples include: °· Transverse myelitis. °· Encephalitis. °· Transient ischemic attack. °· Multiple sclerosis. °· Stroke. °· Tumor or blood vessel problems, such as an arteriovenous malformation pressing against the brain or spinal cord. °· A condition that damages the peripheral nerves (peripheral neuropathy). Peripheral nerves are not part of the brain and spinal cord. These conditions include: °· Diabetes. °· Peripheral  vascular disease. °· Nerve entrapment syndromes, such as carpal tunnel syndrome. °· Shingles. °· Hypothyroidism. °· Vitamin B12 deficiencies. °· Alcoholism. °· Heavy metal poisoning (lead, arsenic). °· Rheumatoid arthritis. °· Systemic lupus erythematosus. °DIAGNOSIS  °Your caregiver will attempt to find the underlying cause of your paresthesia. Your caregiver may: °· Take your medical history. °· Perform a physical exam. °· Order various lab tests. °· Order imaging tests. °TREATMENT  °Treatment for paresthesia depends on the underlying cause. °HOME CARE INSTRUCTIONS °· Avoid drinking alcohol. °· You may consider massage or acupuncture to help relieve your symptoms. °· Keep all follow-up appointments as directed by your caregiver. °SEEK IMMEDIATE MEDICAL CARE IF:  °· You feel weak. °· You have trouble walking or moving. °· You have problems with speech or vision. °· You feel confused. °· You cannot control your bladder or bowel movements. °· You feel numbness after an injury. °· You faint. °· Your burning or prickling feeling gets worse when walking. °· You have pain, cramps, or dizziness. °· You develop a rash. °MAKE SURE YOU: °· Understand these instructions. °· Will watch your condition. °· Will get help right away if you are not doing well or get worse. °Document Released: 10/07/2002 Document Revised: 01/09/2012 Document Reviewed: 07/08/2011 °ExitCare® Patient Information ©2015 [employer]. This information is not intended to replace advice given to you by your health care provider. Make sure you   discuss any questions you have with your health care provider. ° °

## 2015-07-08 NOTE — ED Provider Notes (Signed)
CSN: [id number]     Arrival date & time 07/08/15  0414 History   First MD Initiated Contact with Patient 07/08/15 [phone]     Chief Complaint  Patient presents with  . Chest Pain    The patient said he started having SoB a week ago, but the chest pain and left arm tingling started about three hours ago.     (Consider location/radiation/quality/duration/timing/severity/associated sxs/prior Treatment) Patient is a 34 y.o. male presenting with chest pain. The history is provided by the patient.  Chest Pain He says that he was feeling fine until 1 AM when he had onset of numbness and tingling in his left arm and left leg. There was associated mild discomfort in his chest which he described as a pressure feeling which he rated at 1/10. Nothing made it better nothing made it worse. He denied dyspnea, nausea, diaphoresis. The numbness has persisted and he states he feels like he was weak in his left arm and left flank. He denies any trauma. Nothing makes symptoms better nothing makes them worse. He is a nonsmoker nondrinker. He has never been on medication for cholesterol or blood pressure but states both have been borderline. He is not diabetic and there is no family history of premature coronary atherosclerosis.  Past Medical History  Diagnosis Date  . Keloid of skin    Past Surgical History  Procedure Laterality Date  . Keloid excision     History reviewed. No pertinent family history. Social History  Substance Use Topics  . Smoking status: Never Smoker   . Smokeless tobacco: None  . Alcohol Use: No    Review of Systems  Cardiovascular: Positive for chest pain.  All other systems reviewed and are negative.     Allergies  Review of patient's allergies indicates no known allergies.  Home Medications   Prior to Admission medications   Not on File   BP 138/84 mmHg  Pulse 70  Temp(Src) 98.2 F (36.8 C) (Oral)  Resp 16  Ht  (1.753 m)  Wt 230 lb (104.327 kg)  BMI 33.95  kg/m2  SpO2 98% Physical Exam  Nursing note and vitals reviewed.  34 year old male, resting comfortably and in no acute distress. Vital signs are normal. Oxygen saturation is 98%, which is normal. Head is normocephalic and atraumatic. PERRLA, EOMI. Oropharynx is clear. 2 large keloids are present on the left side of the face. Neck is nontender and supple without adenopathy or JVD. Back is nontender and there is no CVA tenderness. Lungs are clear without rales, wheezes, or rhonchi. Chest is nontender. Heart has regular rate and rhythm without murmur. Abdomen is soft, flat, nontender without masses or hepatosplenomegaly and peristalsis is normoactive. Extremities have no cyanosis or edema, full range of motion is present. Skin is warm and dry without rash. Neurologic: Mental status is normal, cranial nerves are intact, there are no motor or sensory deficits. There is no pronator drift and there is no extinction on double simultaneous stimulation. Motor strength is 5/5 in all tested muscles.  ED Course  Procedures (including critical care time) Labs Review Results for orders placed or performed during the hospital encounter of 07/08/15  Basic metabolic panel  Result Value Ref Range   Sodium 138 135 - 145 mmol/L   Potassium 3.8 3.5 - 5.1 mmol/L   Chloride 101 101 - 111 mmol/L   CO2 29 22 - 32 mmol/L   Glucose, Bld 112 (H) 65 - 99 mg/dL   BUN  8 6 - 20 mg/dL   Creatinine, Ser 1.61 0.61 - 1.24 mg/dL   Calcium 9.3 8.9 - 09.6 mg/dL   GFR calc non Af Amer >60 >60 mL/min   GFR calc Af Amer >60 >60 mL/min   Anion gap 8 5 - 15  CBC  Result Value Ref Range   WBC 9.7 4.0 - 10.5 K/uL   RBC 5.29 4.22 - 5.81 MIL/uL   Hemoglobin 15.6 13.0 - 17.0 g/dL   HCT 04.5 40.9 - 81.1 %   MCV 84.9 78.0 - 100.0 fL   MCH 29.5 26.0 - 34.0 pg   MCHC 34.7 30.0 - 36.0 g/dL   RDW 91.4 78.2 - 95.6 %   Platelets 233 150 - 400 K/uL  I-stat troponin, ED  Result Value Ref Range   Troponin i, poc 0.00 0.00 - 0.08  ng/mL   Comment 3           Imaging Review Dg Chest 2 View  07/08/2015   CLINICAL DATA:  Shortness of breath for couple of weeks. Cough for a few days.  EXAM: CHEST  2 VIEW  COMPARISON:  None.  FINDINGS: The heart size and mediastinal contours are within normal limits. Both lungs are clear. The visualized skeletal structures are unremarkable.  IMPRESSION: No active cardiopulmonary disease.   Electronically Signed   By: Burman Nieves M.D.   On: 07/08/2015 04:59   I have personally reviewed and evaluated these images and lab results as part of my medical decision-making.   EKG Interpretation   Date/Time:  Wednesday July 08 2015 04:36:38 EDT Ventricular Rate:  65 PR Interval:  181 QRS Duration: 113 QT Interval:  386 QTC Calculation: 401 R Axis:   10 Text Interpretation:  Sinus rhythm Borderline intraventricular conduction  delay ST elev, probable normal early repol pattern When compared with ECG  of 03/31/2003, No significant change was found Confirmed by [facility]  MD,  Kebrina Friend ([id number]) on 07/08/2015 5:28:52 AM      MDM   Final diagnoses:  Paresthesia  Atypical chest pain    Paresthesia without any definite neurologic findings. Cause is unclear. He was given a dose of ketorolac and was symptom free following that. Chest pain was felt to be quite atypical. He has no significant risk factors for heart disease, and heart score is 1. No further workup is indicated. He is discharged with instructions to return should any new symptoms occur.    Dione Booze, MD 07/08/15 [phone]

## 2016-01-30 ENCOUNTER — Emergency Department (HOSPITAL_COMMUNITY)
Admission: EM | Admit: 2016-01-30 | Discharge: 2016-01-30 | Disposition: A | Discharge status: Home / Self Care | Payer: PRIVATE HEALTH INSURANCE | Attending: Emergency Medicine | Admitting: Emergency Medicine

## 2016-01-30 ENCOUNTER — Encounter (HOSPITAL_COMMUNITY): Payer: Self-pay

## 2016-01-30 DIAGNOSIS — Z872 Personal history of diseases of the skin and subcutaneous tissue: Secondary | ICD-10-CM | POA: Insufficient documentation

## 2016-01-30 DIAGNOSIS — M545 Low back pain, unspecified: Secondary | ICD-10-CM

## 2016-01-30 DIAGNOSIS — R109 Unspecified abdominal pain: Secondary | ICD-10-CM | POA: Insufficient documentation

## 2016-01-30 LAB — CBC WITH DIFFERENTIAL/PLATELET
Basophils Absolute: 0 10*3/uL (ref 0.0–0.1)
Basophils Relative: 0 %
EOS PCT: 1 %
Eosinophils Absolute: 0.1 10*3/uL (ref 0.0–0.7)
HCT: 42.1 % (ref 39.0–52.0)
Hemoglobin: 14.4 g/dL (ref 13.0–17.0)
LYMPHS ABS: 2.1 10*3/uL (ref 0.7–4.0)
LYMPHS PCT: 21 %
MCH: 28.8 pg (ref 26.0–34.0)
MCHC: 34.2 g/dL (ref 30.0–36.0)
MCV: 84.2 fL (ref 78.0–100.0)
MONO ABS: 0.5 10*3/uL (ref 0.1–1.0)
MONOS PCT: 5 %
Neutro Abs: 7.1 10*3/uL (ref 1.7–7.7)
Neutrophils Relative %: 73 %
PLATELETS: 208 10*3/uL (ref 150–400)
RBC: 5 MIL/uL (ref 4.22–5.81)
RDW: 12.1 % (ref 11.5–15.5)
WBC: 9.8 10*3/uL (ref 4.0–10.5)

## 2016-01-30 LAB — COMPREHENSIVE METABOLIC PANEL
ALT: 21 U/L (ref 17–63)
AST: 21 U/L (ref 15–41)
Albumin: 3.9 g/dL (ref 3.5–5.0)
Alkaline Phosphatase: 48 U/L (ref 38–126)
Anion gap: 8 (ref 5–15)
BILIRUBIN TOTAL: 1.3 mg/dL — AB (ref 0.3–1.2)
BUN: 7 mg/dL (ref 6–20)
CHLORIDE: 106 mmol/L (ref 101–111)
CO2: 26 mmol/L (ref 22–32)
Calcium: 9 mg/dL (ref 8.9–10.3)
Creatinine, Ser: 1.09 mg/dL (ref 0.61–1.24)
Glucose, Bld: 101 mg/dL — ABNORMAL HIGH (ref 65–99)
POTASSIUM: 3.7 mmol/L (ref 3.5–5.1)
Sodium: 140 mmol/L (ref 135–145)
TOTAL PROTEIN: 6.6 g/dL (ref 6.5–8.1)

## 2016-01-30 LAB — LIPASE, BLOOD: LIPASE: 29 U/L (ref 11–51)

## 2016-01-30 MED ORDER — KETOROLAC TROMETHAMINE 30 MG/ML IJ SOLN
30.0000 mg | Freq: Once | INTRAMUSCULAR | Status: AC
  Administered 2016-01-30: 30 mg via INTRAVENOUS
  Filled 2016-01-30: qty 1

## 2016-01-30 MED ORDER — OXYCODONE-ACETAMINOPHEN 5-325 MG PO TABS: 1.0000 | ORAL_TABLET | ORAL | Status: DC | PRN

## 2016-01-30 MED ORDER — NAPROXEN 500 MG PO TABS: 500.0000 mg | ORAL_TABLET | Freq: Two times a day (BID) | ORAL | Status: DC

## 2016-01-30 MED ORDER — HYDROMORPHONE HCL 1 MG/ML IJ SOLN
1.0000 mg | Freq: Once | INTRAMUSCULAR | Status: AC
  Administered 2016-01-30: 1 mg via INTRAVENOUS
  Filled 2016-01-30: qty 1

## 2016-01-30 MED ORDER — DEXAMETHASONE SODIUM PHOSPHATE 10 MG/ML IJ SOLN
10.0000 mg | Freq: Once | INTRAMUSCULAR | Status: AC
  Administered 2016-01-30: 10 mg via INTRAMUSCULAR
  Filled 2016-01-30: qty 1

## 2016-01-30 NOTE — ED Notes (Signed)
Pt ambulates with no problems. 

## 2016-01-30 NOTE — ED Notes (Signed)
Pt is in stable condition upon d/c and ambulates from ED. 

## 2016-01-30 NOTE — Discharge Instructions (Signed)

## 2016-01-30 NOTE — ED Provider Notes (Signed)
CSN: [id number]     Arrival date & time 01/30/16  0603 History   First MD Initiated Contact with Patient 01/30/16 [phone]     Chief Complaint  Patient presents with  . Back Pain     (Consider location/radiation/quality/duration/timing/severity/associated sxs/prior Treatment) Patient is a 35 y.o. male presenting with back pain. The history is provided by the patient.  Back Pain Location:  Lumbar spine Quality: sharp. Radiates to:  Does not radiate Pain severity:  Severe Pain is:  Same all the time Onset quality:  Gradual Duration:  4 hours Timing:  Constant Progression:  Unchanged Chronicity:  Recurrent Relieved by:  Nothing Worsened by:  Bending, ambulation and movement Ineffective treatments:  Ibuprofen Associated symptoms: abdominal pain (intermittent)   Associated symptoms: no bladder incontinence, no bowel incontinence, no chest pain, no dysuria, no fever, no leg pain, no numbness, no paresthesias, no pelvic pain, no perianal numbness, no tingling and no weakness    Paul RichesSteven Frey is a 35 y.o. male with PMH significant for keloids who presents with gradual onset, constant, severe, non-radiating low back pain.  Denies injury/trauma.  He reports he has been seen for low back pain in the past as well. He states this feels somewhat similar. He reports taking Advil with minimal relief.  It's worse with movement and walking.  No modifying factors.  He states lying on his side helps a little.  No bowel/bladder incontinence, urinary symptoms, fever, chills.  Intermittent abdominal pain, but none at present.  He states he thinks the medicine he used to take for his back pain aggravated his stomach.  No IVDU or active malignancy. No hx of back surgery.    Past Medical History  Diagnosis Date  . Keloid of skin    Past Surgical History  Procedure Laterality Date  . Keloid excision     History reviewed. No pertinent family history. Social History  Substance Use Topics  . Smoking status:  Never Smoker   . Smokeless tobacco: None  . Alcohol Use: No    Review of Systems  Constitutional: Negative for fever.  Cardiovascular: Negative for chest pain.  Gastrointestinal: Positive for abdominal pain (intermittent). Negative for bowel incontinence.  Genitourinary: Negative for bladder incontinence, dysuria and pelvic pain.  Musculoskeletal: Positive for back pain and gait problem (2/2 to pain).  Neurological: Negative for tingling, weakness, numbness and paresthesias.  All other systems reviewed and are negative.     Allergies  Review of patient's allergies indicates no known allergies.  Home Medications   Prior to Admission medications   Not on File   BP 134/87 mmHg  Pulse 78  Temp(Src) 98.4 F (36.9 C) (Oral)  Resp 20  Ht 5\' 10"  (1.778 m)  Wt 104.327 kg  BMI 33.00 kg/m2  SpO2 97% Physical Exam  Constitutional: He is oriented to person, place, and time. He appears well-developed and well-nourished.  Patient lying on side in exam room obviously uncomfortable.   HENT:  Head: Atraumatic.  Eyes: Conjunctivae are normal.  Cardiovascular: Normal rate, regular rhythm, normal heart sounds and intact distal pulses.   Pulses:      Dorsalis pedis pulses are 2+ on the right side, and 2+ on the left side.  Pulmonary/Chest: Effort normal and breath sounds normal.  Abdominal: Soft. Bowel sounds are normal. He exhibits no distension. There is no tenderness.  Musculoskeletal:  No spinous process tenderness.  No step offs. No crepitus.  Diffuse lumbar tenderness.  No midline tenderness.  Decreased ROM of lumbar  spine secondary to pain.   Neurological: He is alert and oriented to person, place, and time.  No saddle anesthesia. Strength and sensation intact bilaterally throughout lower extremities.  Skin: Skin is warm and dry.  Psychiatric: He has a normal mood and affect. His behavior is normal.    ED Course  Procedures (including critical care time) Labs Review Labs  Reviewed  COMPREHENSIVE METABOLIC PANEL - Abnormal; Notable for the following:    Glucose, Bld 101 (*)    Total Bilirubin 1.3 (*)    All other components within normal limits  CBC WITH DIFFERENTIAL/PLATELET  LIPASE, BLOOD    Imaging Review No results found. I have personally reviewed and evaluated these images and lab results as part of my medical decision-making.   EKG Interpretation None      MDM   Final diagnoses:  Bilateral low back pain without sciatica   Patient presents with non-radiating low back pain.  VSS, NAD.  No red flags.  On exam, diffuse lumbar tenderness.  Normal neuro exam.  No indication for imaging at this time.  Will treat pain, obtain basic labs, and reassess.  Labs without acute abnormalities. Repeat neurological exam reassuring.  Doubt neurologic emergency such as cauda equina or epidural abscess.  Doubt AAA.  Do infectious etiology.  Patient reports dramatic improvement of symptoms. Patient able to ambulate without difficulty.  Plan to discharge home with percocet and naproxen.  Discussed return precautions.  Follow up with [facility].  Patient agrees and acknowledges the above plan for discharge.       Cheri Fowler, PA-C 01/30/16 1610  Nelva Nay, MD 02/04/16 [id number]

## 2016-01-30 NOTE — ED Notes (Signed)
PA Rose at bedside 

## 2016-01-30 NOTE — ED Notes (Signed)
Pt drove himself here c/o low back pain.

## 2017-04-28 ENCOUNTER — Encounter (HOSPITAL_COMMUNITY): Payer: Self-pay | Admitting: Emergency Medicine

## 2017-04-28 ENCOUNTER — Emergency Department (HOSPITAL_COMMUNITY)
Admission: EM | Admit: 2017-04-28 | Discharge: 2017-04-28 | Disposition: A | Discharge status: Home / Self Care | Payer: BLUE CROSS/BLUE SHIELD | Attending: Emergency Medicine | Admitting: Emergency Medicine

## 2017-04-28 ENCOUNTER — Emergency Department (HOSPITAL_COMMUNITY): Payer: BLUE CROSS/BLUE SHIELD

## 2017-04-28 DIAGNOSIS — R0789 Other chest pain: Secondary | ICD-10-CM | POA: Diagnosis not present

## 2017-04-28 DIAGNOSIS — Z79899 Other long term (current) drug therapy: Secondary | ICD-10-CM | POA: Insufficient documentation

## 2017-04-28 LAB — CBC
HEMATOCRIT: 45.4 % (ref 39.0–52.0)
Hemoglobin: 15.7 g/dL (ref 13.0–17.0)
MCH: 29.5 pg (ref 26.0–34.0)
MCHC: 34.6 g/dL (ref 30.0–36.0)
MCV: 85.2 fL (ref 78.0–100.0)
Platelets: 237 10*3/uL (ref 150–400)
RBC: 5.33 MIL/uL (ref 4.22–5.81)
RDW: 12 % (ref 11.5–15.5)
WBC: 10.7 10*3/uL — ABNORMAL HIGH (ref 4.0–10.5)

## 2017-04-28 LAB — BASIC METABOLIC PANEL
Anion gap: 8 (ref 5–15)
BUN: 8 mg/dL (ref 6–20)
CO2: 26 mmol/L (ref 22–32)
Calcium: 9.4 mg/dL (ref 8.9–10.3)
Chloride: 104 mmol/L (ref 101–111)
Creatinine, Ser: 1.01 mg/dL (ref 0.61–1.24)
GFR calc Af Amer: >60 mL/min (ref >60)
GLUCOSE: 98 mg/dL (ref 65–99)
POTASSIUM: 3.5 mmol/L (ref 3.5–5.1)
Sodium: 138 mmol/L (ref 135–145)

## 2017-04-28 LAB — I-STAT TROPONIN, ED: Troponin i, poc: 0 ng/mL (ref 0.00–0.08)

## 2017-04-28 MED ORDER — ALBUTEROL SULFATE HFA 108 (90 BASE) MCG/ACT IN AERS
2.0000 | INHALATION_SPRAY | RESPIRATORY_TRACT | Status: DC | PRN
  Administered 2017-04-28: 2 via RESPIRATORY_TRACT
  Filled 2017-04-28: qty 6.7

## 2017-04-28 NOTE — ED Provider Notes (Signed)
[facility]-EMERGENCY DEPT Provider Note   CSN: [id number] Arrival date & time: 04/28/17  0155     History   Chief Complaint Chief Complaint  Patient presents with  . Chest Pain  . Shortness of Breath    HPI Paul Frey is a 36 y.o. male.  Patient presents to the emergency department with chief complaint of chest tightness. He states that he has had the symptoms for the past several months. He states that his symptoms were somewhat worse than normal today. He states that his symptoms are exacerbated by the work that he does. 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  He denies any fevers, chills, or cough. He has not taken anything for his symptoms. There are no other associated symptoms. He denies any history of ACS, PE, or DVT.   The history is provided by the patient. No language interpreter was used.    Past Medical History:  Diagnosis Date  . Keloid of skin     There are no active problems to display for this patient.   Past Surgical History:  Procedure Laterality Date  . KELOID EXCISION         Home Medications    Prior to Admission medications   Medication Sig Start Date End Date Taking? Authorizing Provider  naproxen (NAPROSYN) 500 MG tablet Take 1 tablet (500 mg total) by mouth 2 (two) times daily. Patient not taking: Reported on 04/28/2017 01/30/16   Cheri Fowlerose, Kayla, PA-C  oxyCODONE-acetaminophen (PERCOCET/ROXICET) 5-325 MG tablet Take 1 tablet by mouth every 4 (four) hours as needed for severe pain. Patient not taking: Reported on 04/28/2017 01/30/16   Cheri Fowlerose, Kayla, PA-C    Family History History reviewed. No pertinent family history.  Social History Social History  Substance Use Topics  . Smoking status: Never Smoker  . Smokeless tobacco: Never Used  . Alcohol use No     Allergies   Patient has no known allergies.   Review of Systems Review of Systems  All other  systems reviewed and are negative.    Physical Exam Updated Vital Signs BP (!) 136/101 (BP Location: Right Arm)   Pulse 62   Temp 98.2 F (36.8 C) (Oral)   Resp 15   Ht 5\' 9"  (1.753 m)   Wt 97.5 kg (215 lb)   SpO2 97%   BMI 31.75 kg/m   Physical Exam  Constitutional: He is oriented to person, place, and time. He appears well-developed and well-nourished.  HENT:  Head: Normocephalic and atraumatic.  Eyes: Conjunctivae and EOM are normal. Pupils are equal, round, and reactive to light. Right eye exhibits no discharge. Left eye exhibits no discharge. No scleral icterus.  Neck: Normal range of motion. Neck supple. No JVD present.  Cardiovascular: Normal rate, regular rhythm and normal heart sounds.  Exam reveals no gallop and no friction rub.   No murmur heard. Pulmonary/Chest: Effort normal and breath sounds normal. No respiratory distress. He has no wheezes. He has no rales. He exhibits no tenderness.  Abdominal: Soft. He exhibits no distension and no mass. There is no tenderness. There is no rebound and no guarding.  Musculoskeletal: Normal range of motion. He exhibits no edema or tenderness.  Neurological: He is alert and oriented to person, place, and time.  Skin: Skin is warm and dry.  Psychiatric: He has a normal mood and affect. His behavior is normal. Judgment  and thought content normal.  Nursing note and vitals reviewed.    ED Treatments / Results  Labs (all labs ordered are listed, but only abnormal results are displayed) Labs Reviewed  CBC - Abnormal; Notable for the following:       Result Value   WBC 10.7 (*)    All other components within normal limits  BASIC METABOLIC PANEL  I-STAT TROPOININ, ED    EKG  EKG Interpretation  Date/Time:  Friday April 28 2017 02:03:13 EDT Ventricular Rate:  64 PR Interval:  172 QRS Duration: 110 QT Interval:  388 QTC Calculation: 400 R Axis:   30 Text Interpretation:  Normal sinus rhythm Normal ECG When compared with  ECG of 07/08/2015, No significant change was found Confirmed by Dione Booze ([id number]) on 04/28/2017 2:10:21 AM       Radiology Dg Chest 2 View  Result Date: 04/28/2017 CLINICAL DATA:  Chest tightness and dyspnea tonight EXAM: CHEST  2 VIEW COMPARISON:  07/08/2015 FINDINGS: The lungs are clear. The pulmonary vasculature is normal. Heart size is normal. Hilar and mediastinal contours are unremarkable. There is no pleural effusion. IMPRESSION: No active cardiopulmonary disease. Electronically Signed   By: Ellery Plunk M.D.   On: 04/28/2017 02:20    Procedures Procedures (including critical care time)  Medications Ordered in ED Medications - No data to display   Initial Impression / Assessment and Plan / ED Course  I have reviewed the triage vital signs and the nursing notes.  Pertinent labs & imaging results that were available during my care of the patient were reviewed by me and considered in my medical decision making (see chart for details).     Patient with chest tightness times weeks to months. Exacerbated with changes in temperature and humidity. Workup today is reassuring.  HEART score is 0.  PERC negative. Trop negative.  No EKG changes.  Patient's symptoms have been ongoing for quite some time. I will give him an inhaler to see if this helps any of his symptoms. Recommend that he follow-up with a primary care doctor. Patient understands and agrees with the plan.  Final Clinical Impressions(s) / ED Diagnoses   Final diagnoses:  Chest tightness    New Prescriptions New Prescriptions   No medications on file     Felipa Furnace 04/28/17 0525    Zadie Rhine, MD 04/28/17 [phone]

## 2017-04-28 NOTE — ED Triage Notes (Signed)
Pt presents to ED for assessment of central and left sided chest pain x "several months", but SOB included tonight.  PT describes it as chest tightness.  Pt has dry cough at triage.  Pt denies n/v.  Pt c/o dizziness with standing up and position changes.

## 2017-11-18 ENCOUNTER — Emergency Department (HOSPITAL_COMMUNITY)
Admission: EM | Admit: 2017-11-18 | Discharge: 2017-11-18 | Disposition: A | Discharge status: Home / Self Care | Payer: BLUE CROSS/BLUE SHIELD | Attending: Emergency Medicine | Admitting: Emergency Medicine

## 2017-11-18 ENCOUNTER — Encounter (HOSPITAL_COMMUNITY): Payer: Self-pay | Admitting: *Deleted

## 2017-11-18 ENCOUNTER — Other Ambulatory Visit: Payer: Self-pay

## 2017-11-18 DIAGNOSIS — R109 Unspecified abdominal pain: Secondary | ICD-10-CM | POA: Insufficient documentation

## 2017-11-18 DIAGNOSIS — Z5321 Procedure and treatment not carried out due to patient leaving prior to being seen by health care provider: Secondary | ICD-10-CM | POA: Diagnosis not present

## 2017-11-18 LAB — COMPREHENSIVE METABOLIC PANEL
ALBUMIN: 4.4 g/dL (ref 3.5–5.0)
ALK PHOS: 59 U/L (ref 38–126)
ALT: 44 U/L (ref 17–63)
ANION GAP: 10 (ref 5–15)
AST: 34 U/L (ref 15–41)
BUN: 10 mg/dL (ref 6–20)
CALCIUM: 9.5 mg/dL (ref 8.9–10.3)
CO2: 24 mmol/L (ref 22–32)
Chloride: 103 mmol/L (ref 101–111)
Creatinine, Ser: 1.02 mg/dL (ref 0.61–1.24)
GFR calc non Af Amer: >60 mL/min (ref >60)
GLUCOSE: 127 mg/dL — AB (ref 65–99)
POTASSIUM: 3.9 mmol/L (ref 3.5–5.1)
SODIUM: 137 mmol/L (ref 135–145)
TOTAL PROTEIN: 7.6 g/dL (ref 6.5–8.1)
Total Bilirubin: 1.6 mg/dL — ABNORMAL HIGH (ref 0.3–1.2)

## 2017-11-18 LAB — URINALYSIS, ROUTINE W REFLEX MICROSCOPIC
BACTERIA UA: NONE SEEN
Bilirubin Urine: NEGATIVE
GLUCOSE, UA: NEGATIVE mg/dL
HGB URINE DIPSTICK: NEGATIVE
KETONES UR: NEGATIVE mg/dL
Leukocytes, UA: NEGATIVE
NITRITE: NEGATIVE
PROTEIN: NEGATIVE mg/dL
Specific Gravity, Urine: 1.013 (ref 1.005–1.030)
Squamous Epithelial / LPF: NONE SEEN
pH: 5 (ref 5.0–8.0)

## 2017-11-18 LAB — CBC
HEMATOCRIT: 48.4 % (ref 39.0–52.0)
HEMOGLOBIN: 16.7 g/dL (ref 13.0–17.0)
MCH: 29.6 pg (ref 26.0–34.0)
MCHC: 34.5 g/dL (ref 30.0–36.0)
MCV: 85.7 fL (ref 78.0–100.0)
Platelets: 242 10*3/uL (ref 150–400)
RBC: 5.65 MIL/uL (ref 4.22–5.81)
RDW: 12.8 % (ref 11.5–15.5)
WBC: 10.7 10*3/uL — ABNORMAL HIGH (ref 4.0–10.5)

## 2017-11-18 LAB — LIPASE, BLOOD: Lipase: 31 U/L (ref 11–51)

## 2017-11-18 NOTE — ED Notes (Signed)
Pt states he is going to his primary doctor due to long wait time. Pt ambulatory.

## 2017-11-18 NOTE — ED Triage Notes (Signed)
The pt has had abd pain for 2 weeks with n and vomiting  No diarrhea

## 2017-11-26 ENCOUNTER — Emergency Department (HOSPITAL_COMMUNITY): Payer: BLUE CROSS/BLUE SHIELD

## 2017-11-26 ENCOUNTER — Encounter (HOSPITAL_COMMUNITY): Payer: Self-pay

## 2017-11-26 ENCOUNTER — Emergency Department (HOSPITAL_COMMUNITY)
Admission: EM | Admit: 2017-11-26 | Discharge: 2017-11-27 | Disposition: A | Discharge status: Home / Self Care | Payer: BLUE CROSS/BLUE SHIELD | Attending: Emergency Medicine | Admitting: Emergency Medicine

## 2017-11-26 DIAGNOSIS — J069 Acute upper respiratory infection, unspecified: Secondary | ICD-10-CM | POA: Insufficient documentation

## 2017-11-26 DIAGNOSIS — R0981 Nasal congestion: Secondary | ICD-10-CM | POA: Diagnosis present

## 2017-11-26 MED ORDER — IPRATROPIUM-ALBUTEROL 0.5-2.5 (3) MG/3ML IN SOLN
3.0000 mL | Freq: Once | RESPIRATORY_TRACT | Status: AC
  Administered 2017-11-26: 3 mL via RESPIRATORY_TRACT
  Filled 2017-11-26: qty 3

## 2017-11-26 NOTE — ED Triage Notes (Signed)
Pt c/o congestion coughing up yellow mucus x2 days.  OTC medicine not working.

## 2017-11-26 NOTE — ED Notes (Signed)
Pt does not want to do the EKG and blood work today. 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 Will let md know.

## 2017-11-26 NOTE — ED Provider Notes (Signed)
[facility] Provider Note   CSN: [id number] Arrival date & time: 11/26/17  2245     History   Chief Complaint Chief Complaint  Patient presents with  . Nasal Congestion    HPI Paul Frey is a 37 y.o. male.  HPI   37 y/o male presenting with c/o productive cough yellow mucous and nasal congestion for the last few days. Reports postnasal drainage, sore throat (resolved), and nausea. 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 No worse with exertion. He states that this feeling in his chest is chronic and he has been worked up in the past for a cardiac cause and the workup was negative.  Denies fevers, headaches, sinus pressure/pain, shortness of breath, abd pain, vomting, diarrhea, constipation. Denies any calf swelling, pain, redness. Denies a h/o CA or blood clots. No recent surgeries or long trips. No hormone therapy.   Past Medical History:  Diagnosis Date  . Keloid of skin     There are no active problems to display for this patient.   Past Surgical History:  Procedure Laterality Date  . KELOID EXCISION         Home Medications    Prior to Admission medications   Medication Sig Start Date End Date Taking? Authorizing Provider  fluticasone (FLONASE) 50 MCG/ACT nasal spray Place 2 sprays into both nostrils daily. 11/27/17   Jayjay Littles S, PA-C  guaiFENesin (MUCINEX) 600 MG 12 hr tablet Take 1 tablet (600 mg total) by mouth every 12 (twelve) hours for 3 days. You can take one tablet every 12 hours as needed. Take the tablet with a full glass of water. 11/27/17 11/30/17  Elmo Rio S, PA-C  naproxen (NAPROSYN) 500 MG tablet Take 1 tablet (500 mg total) by mouth 2 (two) times daily. Patient not taking: Reported on 04/28/2017 01/30/16   Cheri Fowlerose, Kayla, PA-C  oxyCODONE-acetaminophen (PERCOCET/ROXICET) 5-325 MG tablet Take 1 tablet by mouth every 4 (four) hours as needed for severe  pain. Patient not taking: Reported on 04/28/2017 01/30/16   Cheri Fowlerose, Kayla, PA-C    Family History History reviewed. No pertinent family history.  Social History Social History   Tobacco Use  . Smoking status: Never Smoker  . Smokeless tobacco: Never Used  Substance Use Topics  . Alcohol use: No  . Drug use: No     Allergies   Patient has no known allergies.   Review of Systems Review of Systems  Constitutional: Negative for chills and fever.  HENT: Positive for congestion and sore throat. Negative for ear pain.   Eyes: Negative for pain and visual disturbance.  Respiratory: Positive for cough. Negative for shortness of breath.   Cardiovascular: Negative for chest pain and palpitations.       Chest tightness  Gastrointestinal: Positive for nausea. Negative for abdominal pain, constipation, diarrhea and vomiting.  Genitourinary: Negative for dysuria and hematuria.  Musculoskeletal: Negative for arthralgias and back pain.  Skin: Negative for color change and rash.  Neurological: Negative for seizures and syncope.  All other systems reviewed and are negative.    Physical Exam Updated Vital Signs BP (!) 140/93 (BP Location: Right Arm)   Pulse 65   Resp 20   SpO2 99%   Physical Exam  Constitutional: He is oriented to person, place, and time. He appears well-developed and well-nourished. No distress.  HENT:  Head: Normocephalic and atraumatic.  Right Ear: External ear normal.  Left Ear:  External ear normal.  Nose: Nose normal.  Mouth/Throat: Oropharynx is clear and moist. No oropharyngeal exudate.  Bilat TMs without erythema or bulging. No tonsillar swelling. No exudates. No evidence of peritonsillar abscess. Tolerating secretions  Eyes: Conjunctivae and EOM are normal. Pupils are equal, round, and reactive to light.  Neck: Normal range of motion. Neck supple.  No nuchal rigidity. No pain with neck flexion. Able to touch chin to chest.  Cardiovascular: Normal rate,  regular rhythm, normal heart sounds and intact distal pulses. Exam reveals no friction rub.  No murmur heard. Pulmonary/Chest: Effort normal and breath sounds normal. No stridor. No respiratory distress. He has no wheezes. He has no rales.  Abdominal: Soft. Bowel sounds are normal. He exhibits no distension. There is no tenderness. There is no guarding.  Musculoskeletal: He exhibits no edema.  No calf pain, erythema, or swelling/edema.  Lymphadenopathy:    He has no cervical adenopathy.  Neurological: He is alert and oriented to person, place, and time.  Skin: Skin is warm and dry. Capillary refill takes less than 2 seconds.  Psychiatric: He has a normal mood and affect.  Nursing note and vitals reviewed.    ED Treatments / Results  Labs (all labs ordered are listed, but only abnormal results are displayed) Labs Reviewed  I-STAT TROPONIN, ED    EKG  EKG Interpretation  Date/Time:  Monday November 27 2017 00:30:19 EST Ventricular Rate:  74 PR Interval:    QRS Duration: 116 QT Interval:  379 QTC Calculation: 421 R Axis:   14 Text Interpretation:  Sinus rhythm Nonspecific intraventricular conduction delay ST elev, probable normal early repol pattern No significant change since last tracing in 2000 Confirmed by Rochele Raring [phone]) on 11/27/2017 12:48:16 AM       Radiology Dg Chest 2 View  Result Date: 11/26/2017 CLINICAL DATA:  Cough for 2 days. EXAM: CHEST  2 VIEW COMPARISON:  Chest x-ray dated 04/28/2017. FINDINGS: The heart size and mediastinal contours are within normal limits. Both lungs are clear. The visualized skeletal structures are unremarkable. IMPRESSION: No active cardiopulmonary disease. No evidence of pneumonia or pulmonary edema. Electronically Signed   By: Bary Richard M.D.   On: 11/26/2017 23:29    Procedures Procedures (including critical care time)  Medications Ordered in ED Medications  ipratropium-albuterol (DUONEB) 0.5-2.5 (3) MG/3ML nebulizer  solution 3 mL (3 mLs Nebulization Given 11/26/17 2353)     Initial Impression / Assessment and Plan / ED Course  I have reviewed the triage vital signs and the nursing notes.  Pertinent labs & imaging results that were available during my care of the patient were reviewed by me and considered in my medical decision making (see chart for details).   11:58 PM Nursing update. Pt is refusing to have troponin drawn and EKG completed. He states that he always has this chest tightness and it is not new. He does not feel that he is having an issue with his heart and he does not want to have the workup completed. He is willing to have the nebulizer treatment and he is currently completing this right now.   Rechecked pt. He is finished with his nebulizer treatment. He states that he did not complete the entire treatment because he did not feel that he needed it. Discussed that I would like to get trop and EKG and he is agreeable. Stated if these are negative than he can be discharged with symptomatic management of his URI. Discussed primary care follow up  and reasons to return to the ER. Pt is agreeable to do so.   Rechecked pt and discussed results of labs, EKG. Discussed plan for discharge with symptomatic management and pt is agreeable to this. Adivsed PCP f/u and return precautions given.    Final Clinical Impressions(s) / ED Diagnoses   Final diagnoses:  Upper respiratory tract infection, unspecified type    Pt CXR negative for acute infiltrate. Patients symptoms are consistent with URI, likely viral etiology. Discussed that antibiotics are not indicated for viral infections. Pt will be discharged with symptomatic treatment.  Verbalizes understanding and is agreeable with plan. Pt is hemodynamically stable & in NAD prior to dc.  Chest tightness is unlikely cardiac in nature. Patient endorses that this feeling is chronic for multiple months and unchanged from previous. Pain is nonexertional. States  it has been worked up in the past and that the cardiac workup was negative. ECG with early repol, but no ST elevations or depressions suggestive of ischemia or infarct. PERC negative. Heart score 1 and is low risk.    ED Discharge Orders        Ordered    fluticasone (FLONASE) 50 MCG/ACT nasal spray  Daily     11/27/17 0055    guaiFENesin (MUCINEX) 600 MG 12 hr tablet  Every 12 hours     11/27/17 0055       Karrie Meres, PA-C 11/27/17 0354    Doug Sou, MD 11/27/17 1346

## 2017-11-27 LAB — I-STAT TROPONIN, ED: Troponin i, poc: 0 ng/mL (ref 0.00–0.08)

## 2017-11-27 MED ORDER — GUAIFENESIN ER 600 MG PO TB12: 600.0000 mg | ORAL_TABLET | Freq: Two times a day (BID) | ORAL | 0 refills | Status: AC

## 2017-11-27 MED ORDER — FLUTICASONE PROPIONATE 50 MCG/ACT NA SUSP: 2.0000 | Freq: Every day | NASAL | 0 refills | Status: AC

## 2018-04-14 ENCOUNTER — Other Ambulatory Visit: Payer: Self-pay

## 2018-04-14 ENCOUNTER — Encounter (HOSPITAL_COMMUNITY): Payer: Self-pay | Admitting: Emergency Medicine

## 2018-04-14 ENCOUNTER — Emergency Department (HOSPITAL_COMMUNITY)
Admission: EM | Admit: 2018-04-14 | Discharge: 2018-04-14 | Disposition: A | Discharge status: Home / Self Care | Payer: BLUE CROSS/BLUE SHIELD | Attending: Emergency Medicine | Admitting: Emergency Medicine

## 2018-04-14 DIAGNOSIS — R109 Unspecified abdominal pain: Secondary | ICD-10-CM | POA: Diagnosis present

## 2018-04-14 DIAGNOSIS — R1084 Generalized abdominal pain: Secondary | ICD-10-CM

## 2018-04-14 LAB — CBC
HEMATOCRIT: 49.2 % (ref 39.0–52.0)
Hemoglobin: 16 g/dL (ref 13.0–17.0)
MCH: 28.1 pg (ref 26.0–34.0)
MCHC: 32.5 g/dL (ref 30.0–36.0)
MCV: 86.3 fL (ref 78.0–100.0)
Platelets: 218 10*3/uL (ref 150–400)
RBC: 5.7 MIL/uL (ref 4.22–5.81)
RDW: 12.2 % (ref 11.5–15.5)
WBC: 8 10*3/uL (ref 4.0–10.5)

## 2018-04-14 LAB — COMPREHENSIVE METABOLIC PANEL
ALBUMIN: 4.1 g/dL (ref 3.5–5.0)
ALT: 53 U/L (ref 17–63)
AST: 39 U/L (ref 15–41)
Alkaline Phosphatase: 53 U/L (ref 38–126)
Anion gap: 9 (ref 5–15)
BILIRUBIN TOTAL: 1.9 mg/dL — AB (ref 0.3–1.2)
BUN: 5 mg/dL — AB (ref 6–20)
CALCIUM: 9.2 mg/dL (ref 8.9–10.3)
CO2: 28 mmol/L (ref 22–32)
Chloride: 103 mmol/L (ref 101–111)
Creatinine, Ser: 1.13 mg/dL (ref 0.61–1.24)
GFR calc Af Amer: >60 mL/min (ref >60)
GLUCOSE: 95 mg/dL (ref 65–99)
POTASSIUM: 3.6 mmol/L (ref 3.5–5.1)
Sodium: 140 mmol/L (ref 135–145)
TOTAL PROTEIN: 8 g/dL (ref 6.5–8.1)

## 2018-04-14 LAB — LIPASE, BLOOD: Lipase: 27 U/L (ref 11–51)

## 2018-04-14 MED ORDER — ACETAMINOPHEN 500 MG PO TABS
1000.0000 mg | ORAL_TABLET | Freq: Once | ORAL | Status: AC
  Administered 2018-04-14: 1000 mg via ORAL
  Filled 2018-04-14: qty 2

## 2018-04-14 MED ORDER — ALUM & MAG HYDROXIDE-SIMETH 200-200-20 MG/5ML PO SUSP
15.0000 mL | Freq: Once | ORAL | Status: AC
  Administered 2018-04-14: 15 mL via ORAL
  Filled 2018-04-14: qty 30

## 2018-04-14 MED ORDER — FAMOTIDINE 20 MG PO TABS
20.0000 mg | ORAL_TABLET | Freq: Once | ORAL | Status: AC
  Administered 2018-04-14: 20 mg via ORAL
  Filled 2018-04-14: qty 1

## 2018-04-14 NOTE — ED Triage Notes (Signed)
Patient to ED c/o abd pain x 1 week, states he feels a knot in his stomach and has constant cramping that worsens anytime he eats. Was taking amoxicillin for sinus infection, but only finished half of it d/t it making his symptoms better. States he had diarrhea a couple of days ago, but none since. Denies N/V, no fevers/chills.

## 2018-04-14 NOTE — ED Provider Notes (Signed)
[facility] EMERGENCY DEPARTMENT Provider Note   CSN: [id number] Arrival date & time: 04/14/18  1330     History   Chief Complaint Chief Complaint  Patient presents with  . Abdominal Pain    HPI Paul Frey is a 36 y.o. male.  Patient c/o intermittent abd pain/cramping in past week. Was recently on augmentin for sinus infection and stopped taking that 3 days ago. Pain currently is improved. Last bm was 2 days ago. No abd distension. No nausea or vomiting. No normal appetite. No fever or chills. No dysuria or gu c/o. No back or flank pain. Denies hx pud, pancreatitis, or gallstones. Pain is epigastric, mid abd, bil, mild, non radiating. No specific exacerbating or alleviating factors.   The history is provided by the patient.  Abdominal Pain   Pertinent negatives include fever, diarrhea, vomiting, dysuria and headaches.    Past Medical History:  Diagnosis Date  . Keloid of skin     There are no active problems to display for this patient.   Past Surgical History:  Procedure Laterality Date  . KELOID EXCISION          Home Medications    Prior to Admission medications   Medication Sig Start Date End Date Taking? Authorizing Provider  fluticasone (FLONASE) 50 MCG/ACT nasal spray Place 2 sprays into both nostrils daily. 11/27/17   Couture, Cortni S, PA-C  naproxen (NAPROSYN) 500 MG tablet Take 1 tablet (500 mg total) by mouth 2 (two) times daily. Patient not taking: Reported on 04/28/2017 01/30/16   Cheri Fowler, PA-C  oxyCODONE-acetaminophen (PERCOCET/ROXICET) 5-325 MG tablet Take 1 tablet by mouth every 4 (four) hours as needed for severe pain. Patient not taking: Reported on 04/28/2017 01/30/16   Cheri Fowler, PA-C    Family History No family history on file.  Social History Social History   Tobacco Use  . Smoking status: Never Smoker  . Smokeless tobacco: Never Used  Substance Use Topics  . Alcohol use: No  . Drug use: No     Allergies     Patient has no known allergies.   Review of Systems Review of Systems  Constitutional: Negative for fever.  HENT: Negative for sore throat.   Eyes: Negative for redness.  Respiratory: Negative for cough.   Cardiovascular: Negative for chest pain.  Gastrointestinal: Positive for abdominal pain. Negative for diarrhea and vomiting.  Genitourinary: Negative for dysuria and flank pain.  Musculoskeletal: Negative for back pain.  Skin: Negative for rash.  Neurological: Negative for headaches.  Hematological: Does not bruise/bleed easily.  Psychiatric/Behavioral: Negative for confusion.     Physical Exam Updated Vital Signs BP (!) 129/94 (BP Location: Right Arm)   Pulse 71   Temp 98.7 F (37.1 C) (Oral)   Resp 18   Ht 1.778 m (5\' 10" )   Wt 114.8 kg (253 lb)   SpO2 99%   BMI 36.30 kg/m   Physical Exam  Constitutional: He appears well-developed and well-nourished.  HENT:  Mouth/Throat: Oropharynx is clear and moist.  Eyes: Conjunctivae are normal. No scleral icterus.  Neck: Neck supple. No tracheal deviation present.  Cardiovascular: Normal rate, regular rhythm, normal heart sounds and intact distal pulses. Exam reveals no gallop and no friction rub.  No murmur heard. Pulmonary/Chest: Effort normal and breath sounds normal. No accessory muscle usage. No respiratory distress.  Abdominal: Soft. Bowel sounds are normal. He exhibits no distension and no mass. There is no tenderness. There is no rebound and no guarding.  No hernia.  Genitourinary:  Genitourinary Comments: No cva tenderness  Musculoskeletal: He exhibits no edema.  Neurological: He is alert.  Skin: Skin is warm and dry.  Psychiatric: He has a normal mood and affect.  Nursing note and vitals reviewed.    ED Treatments / Results  Labs (all labs ordered are listed, but only abnormal results are displayed) Results for orders placed or performed during the hospital encounter of 04/14/18  Lipase, blood  Result  Value Ref Range   Lipase 27 11 - 51 U/L  Comprehensive metabolic panel  Result Value Ref Range   Sodium 140 135 - 145 mmol/L   Potassium 3.6 3.5 - 5.1 mmol/L   Chloride 103 101 - 111 mmol/L   CO2 28 22 - 32 mmol/L   Glucose, Bld 95 65 - 99 mg/dL   BUN 5 (L) 6 - 20 mg/dL   Creatinine, Ser 1.611.13 0.61 - 1.24 mg/dL   Calcium 9.2 8.9 - 09.610.3 mg/dL   Total Protein 8.0 6.5 - 8.1 g/dL   Albumin 4.1 3.5 - 5.0 g/dL   AST 39 15 - 41 U/L   ALT 53 17 - 63 U/L   Alkaline Phosphatase 53 38 - 126 U/L   Total Bilirubin 1.9 (H) 0.3 - 1.2 mg/dL   GFR calc non Af Amer >60 >60 mL/min   GFR calc Af Amer >60 >60 mL/min   Anion gap 9 5 - 15  CBC  Result Value Ref Range   WBC 8.0 4.0 - 10.5 K/uL   RBC 5.70 4.22 - 5.81 MIL/uL   Hemoglobin 16.0 13.0 - 17.0 g/dL   HCT 04.549.2 40.939.0 - 81.152.0 %   MCV 86.3 78.0 - 100.0 fL   MCH 28.1 26.0 - 34.0 pg   MCHC 32.5 30.0 - 36.0 g/dL   RDW 91.412.2 78.211.5 - 95.615.5 %   Platelets 218 150 - 400 K/uL    EKG None  Radiology No results found.  Procedures Procedures (including critical care time)  Medications Ordered in ED Medications  famotidine (PEPCID) tablet 20 mg (has no administration in time range)  acetaminophen (TYLENOL) tablet 1,000 mg (has no administration in time range)  alum & mag hydroxide-simeth (MAALOX/MYLANTA) 200-200-20 MG/5ML suspension 15 mL (has no administration in time range)     Initial Impression / Assessment and Plan / ED Course  I have reviewed the triage vital signs and the nursing notes.  Pertinent labs & imaging results that were available during my care of the patient were reviewed by me and considered in my medical decision making (see chart for details).  Labs sent per standing orders/triage eval.   Pepcid, maalox, and acetaminophen given for symptom improvement.   Reviewed nursing notes and prior charts for additional history.   Abdominal exam is benign, normal, no tenderness. Normal appetite. No fevers.   Recent symptoms may be  related to recent abx use.  Pt currently improved, w benign exam, and appears stable for d/c.     Final Clinical Impressions(s) / ED Diagnoses   Final diagnoses:  None    ED Discharge Orders    None       Cathren LaineSteinl, Hikeem Andersson, MD 04/14/18 1552

## 2018-04-14 NOTE — Discharge Instructions (Signed)
It was our pleasure to provide your ER care today - we hope that you feel better.  Your lab tests look good/normal.   Drink plenty of fluids.  You may try taking pepcid and maalox as need for symptom relief.  Follow up with primary care doctor in the coming week for recheck if symptoms fail to improve/resolve.  Return to ER if worse, new symptoms, fevers, worsening or severe pain, persistent vomiting, other concern.

## 2018-10-28 ENCOUNTER — Emergency Department (HOSPITAL_COMMUNITY)
Admission: EM | Admit: 2018-10-28 | Discharge: 2018-10-29 | Disposition: A | Discharge status: Home / Self Care | Payer: BLUE CROSS/BLUE SHIELD | Attending: Emergency Medicine | Admitting: Emergency Medicine

## 2018-10-28 ENCOUNTER — Other Ambulatory Visit: Payer: Self-pay

## 2018-10-28 ENCOUNTER — Encounter (HOSPITAL_COMMUNITY): Payer: Self-pay | Admitting: Emergency Medicine

## 2018-10-28 DIAGNOSIS — M62838 Other muscle spasm: Secondary | ICD-10-CM | POA: Insufficient documentation

## 2018-10-28 DIAGNOSIS — R202 Paresthesia of skin: Secondary | ICD-10-CM | POA: Insufficient documentation

## 2018-10-28 NOTE — ED Triage Notes (Signed)
C/o numbness and tingling to bilateral arms, pressure to L side of face and L eye, and generalized body aches x 1 week.  States he is unable to sleep tonight due to numbness and tingling.  Denies pain at present.  No arm drift.

## 2018-10-29 MED ORDER — CYCLOBENZAPRINE HCL 10 MG PO TABS: 10.0000 mg | ORAL_TABLET | Freq: Two times a day (BID) | ORAL | 0 refills | Status: DC | PRN

## 2018-10-29 NOTE — ED Provider Notes (Signed)
[facility]   CSN: [id number] Arrival date & time: 10/28/18  2331     History   Chief Complaint Chief Complaint  Patient presents with  . numbness/tingling to bilateral arms  . Generalized Body Aches    HPI Paul Frey is a 37 y.o. male.  The history is provided by the patient.  Neurologic Problem  This is a new problem. The current episode started more than 1 week ago. The problem occurs constantly. The problem has been gradually improving. Pertinent negatives include no chest pain, no abdominal pain, no headaches and no shortness of breath. Nothing aggravates the symptoms. Nothing relieves the symptoms.  Presents with numbness and pain in both arms, right worse than left.  This has been present for over a week.  He does report at times he has numbness and pain to the dorsal aspect of right foot.  Also reports pain around his left eye that he think is from a signs It is worse at night and he is unable to sleep.  He also feels weak.  No incontinence.  He is able to ambulate. Patient reports it appeared to worsen after he was seen by a chiropractor who manipulated his right shoulder. Past Medical History:  Diagnosis Date  . Keloid of skin     There are no active problems to display for this patient.   Past Surgical History:  Procedure Laterality Date  . KELOID EXCISION          Home Medications    Prior to Admission medications   Medication Sig Start Date End Date Taking? Authorizing Provider  fluticasone (FLONASE) 50 MCG/ACT nasal spray Place 2 sprays into both nostrils daily. 11/27/17  Yes Couture, Cortni S, PA-C  cyclobenzaprine (FLEXERIL) 10 MG tablet Take 1 tablet (10 mg total) by mouth 2 (two) times daily as needed for muscle spasms. 10/29/18   Zadie RhineWickline, Lavere Shinsky, MD    Family History No family history on file.  Social History Social History   Tobacco Use  . Smoking status: Never Smoker  . Smokeless  tobacco: Never Used  Substance Use Topics  . Alcohol use: No  . Drug use: No     Allergies   Patient has no known allergies.   Review of Systems Review of Systems  Constitutional: Negative for fever.  Respiratory: Negative for shortness of breath.   Cardiovascular: Negative for chest pain.  Gastrointestinal: Negative for abdominal pain and vomiting.  Musculoskeletal: Positive for arthralgias and neck pain. Negative for back pain.  Neurological: Positive for weakness and numbness. Negative for headaches.  All other systems reviewed and are negative.    Physical Exam Updated Vital Signs BP (!) 147/117   Pulse 68   Temp 97.9 F (36.6 C) (Oral)   Resp 18   SpO2 100%   Physical Exam CONSTITUTIONAL: Well developed/well nourished, smiling and interactive HEAD: Normocephalic/atraumatic EYES: EOMI/PERRL, no nystagmus, no ptosis ENMT: Mucous membranes moist NECK: supple no meningeal signs, no bruits SPINE/BACK:entire spine nontender CV: S1/S2 noted, no murmurs/rubs/gallops noted LUNGS: Lungs are clear to auscultation bilaterally, no apparent distress ABDOMEN: soft, nontender NEURO:Awake/alert, face symmetric, no arm or leg drift is noted Equal 5/5 strength with shoulder abduction, elbow flex/extension, wrist flex/extension in upper extremities and equal hand grips bilaterally Equal 5/5 strength with hip flexion,knee flex/extension, foot dorsi/plantar flexion Cranial nerves 3/4/5/6/05/08/09/11/12 tested and intact Gait normal without ataxia Equal power (5/5) with hand grip, wrist flex/extension, elbow flex/extension, and equal power with shoulder  abduction/adduction.   Equal (2+) biceps/brachioradialis reflex in bilateral UE He reports numbness to right bicep and right hand He reports numbness/pain to dorsal aspect of right foot EXTREMITIES: pulses normal, full ROM, no deformities SKIN: warm, color normal PSYCH: no abnormalities of mood noted, alert and oriented to  situation    ED Treatments / Results  Labs (all labs ordered are listed, but only abnormal results are displayed) Labs Reviewed - No data to display  EKG None  Radiology No results found.  Procedures Procedures (including critical care time)  Medications Ordered in ED Medications - No data to display   Initial Impression / Assessment and Plan / ED Course  I have reviewed the triage vital signs and the nursing notes.   Patient is convinced this all started when he had his right arm manipulated by chiropractor.  There is no focal motor deficits.  He reports subjective numbness to the right bicep and right hand.  He can ambulate without difficulty.  No other acute neurologic deficits noted He reports at times he has spasms in his arms.  We decided to start him on muscle relaxant. We discussed return precautions. Final Clinical Impressions(s) / ED Diagnoses   Final diagnoses:  Paresthesia  Muscle spasm    ED Discharge Orders         Ordered    cyclobenzaprine (FLEXERIL) 10 MG tablet  2 times daily PRN     10/29/18 0235           Zadie RhineWickline, Ericson Nafziger, MD 10/29/18 [phone]

## 2018-10-30 ENCOUNTER — Emergency Department (HOSPITAL_COMMUNITY)
Admission: EM | Admit: 2018-10-30 | Discharge: 2018-10-30 | Disposition: A | Discharge status: Home / Self Care | Payer: Self-pay | Attending: Emergency Medicine | Admitting: Emergency Medicine

## 2018-10-30 ENCOUNTER — Encounter (HOSPITAL_COMMUNITY): Payer: Self-pay | Admitting: *Deleted

## 2018-10-30 DIAGNOSIS — R202 Paresthesia of skin: Secondary | ICD-10-CM | POA: Insufficient documentation

## 2018-10-30 DIAGNOSIS — M542 Cervicalgia: Secondary | ICD-10-CM | POA: Insufficient documentation

## 2018-10-30 MED ORDER — DEXAMETHASONE SODIUM PHOSPHATE 10 MG/ML IJ SOLN
10.0000 mg | Freq: Once | INTRAMUSCULAR | Status: AC
  Administered 2018-10-30: 10 mg via INTRAMUSCULAR
  Filled 2018-10-30: qty 1

## 2018-10-30 NOTE — ED Triage Notes (Signed)
Pt in c/o tingling in his bilateral hands, neck stiffness and back pain, seen for same two days ago, ambulatory without distress

## 2018-10-30 NOTE — ED Provider Notes (Signed)
[facility]   CSN: [id number] Arrival date & time: 10/30/18  1448     History   Chief Complaint Chief Complaint  Patient presents with  . Numbness  . Back Pain    HPI Imagene RichesSteven Estis is a 37 y.o. male.  Patient is a 37 year old male with no significant past medical history who presents with paresthesias.  For about the last week he has had some burning pain that radiates down his right arm.  Occasionally has some tingling in his hands.  He has had some ongoing pain in his neck and his low back.  He just started to have the burning in his right arm about a week ago with a tingling.  He denies any weakness in his hands or his feet.  He denies any fevers.  No recent injuries.  He was seen here 2 days ago and was given a prescription for Flexeril but he forgot that he had the prescription so he has not picked it up.  He has not followed up with his primary care physician.  He came in to get it rechecked.  He has been going to a [facility].  He denies any significant headaches.  No vision changes.  No trouble with his balance.  No loss of bowel or bladder control.     Past Medical History:  Diagnosis Date  . Keloid of skin     There are no active problems to display for this patient.   Past Surgical History:  Procedure Laterality Date  . KELOID EXCISION          Home Medications    Prior to Admission medications   Medication Sig Start Date End Date Taking? Authorizing Provider  cyclobenzaprine (FLEXERIL) 10 MG tablet Take 1 tablet (10 mg total) by mouth 2 (two) times daily as needed for muscle spasms. 10/29/18   Zadie RhineWickline, Donald, MD  fluticasone (FLONASE) 50 MCG/ACT nasal spray Place 2 sprays into both nostrils daily. 11/27/17   Couture, Cortni S, PA-C    Family History History reviewed. No pertinent family history.  Social History Social History   Tobacco Use  . Smoking status: Never Smoker  . Smokeless tobacco:  Never Used  Substance Use Topics  . Alcohol use: No  . Drug use: No     Allergies   Patient has no known allergies.   Review of Systems Review of Systems  Constitutional: Negative for chills, diaphoresis, fatigue and fever.  HENT: Negative for congestion, rhinorrhea and sneezing.   Eyes: Negative.   Respiratory: Negative for cough, chest tightness and shortness of breath.   Cardiovascular: Negative for chest pain and leg swelling.  Gastrointestinal: Negative for abdominal pain, blood in stool, diarrhea, nausea and vomiting.  Genitourinary: Negative for difficulty urinating, flank pain, frequency and hematuria.  Musculoskeletal: Positive for back pain and neck pain. Negative for arthralgias.  Skin: Negative for rash.  Neurological: Positive for numbness. Negative for dizziness, speech difficulty, weakness and headaches.     Physical Exam Updated Vital Signs BP (!) 160/102 (BP Location: Right Arm)   Pulse 77   Temp 98 F (36.7 C) (Oral)   Resp 16   SpO2 98%   Physical Exam Constitutional:      Appearance: He is well-developed.  HENT:     Head: Normocephalic and atraumatic.  Eyes:     Pupils: Pupils are equal, round, and reactive to light.  Neck:     Musculoskeletal: Normal range of motion and  neck supple.     Comments: Patient has some mild tenderness to his lower cervical spine.  There is no pain to the thoracic or lumbosacral spine.  No step-offs or deformities are noted. Cardiovascular:     Rate and Rhythm: Normal rate and regular rhythm.     Heart sounds: Normal heart sounds.  Pulmonary:     Effort: Pulmonary effort is normal. No respiratory distress.     Breath sounds: Normal breath sounds. No wheezing or rales.  Chest:     Chest wall: No tenderness.  Abdominal:     General: Bowel sounds are normal.     Palpations: Abdomen is soft.     Tenderness: There is no abdominal tenderness. There is no guarding or rebound.  Musculoskeletal: Normal range of motion.    Lymphadenopathy:     Cervical: No cervical adenopathy.  Skin:    General: Skin is warm and dry.     Findings: No rash.  Neurological:     Mental Status: He is alert and oriented to person, place, and time.     Comments: Patient has normal motor strength in all extremities.  Specifically in his arms he has normal wrist flexion extension, normal flexion extension of the elbow.  Normal AB duction and adduction of the shoulders.  He has normal flexion extension of the lower extremities.  He has a normal gait.  He has normal sensation to light touch in all 4 extremities.  Pulses are intact.      ED Treatments / Results  Labs (all labs ordered are listed, but only abnormal results are displayed) Labs Reviewed - No data to display  EKG None  Radiology No results found.  Procedures Procedures (including critical care time)  Medications Ordered in ED Medications  dexamethasone (DECADRON) injection 10 mg (has no administration in time range)     Initial Impression / Assessment and Plan / ED Course  I have reviewed the triage vital signs and the nursing notes.  Pertinent labs & imaging results that were available during my care of the patient were reviewed by me and considered in my medical decision making (see chart for details).     Patient presents with a burning radicular pain down his right arm.  He has some intermittent tingling of his hands and his feet but denies any currently.  He is neurologically intact.  He denies any recent trauma.  He does have some pain to his lower cervical spine.  I feel ultimately he may need an MRI but I advised him he will need to follow-up as an outpatient regarding this.  I did give him a shot of Decadron and he has a prescription for Flexeril at the pharmacy which he can pick up.  He was given a referral to follow-up with orthopedics.  He was advised you to follow-up with the orthopedist or his primary care physician.  Return precautions were  given.  Final Clinical Impressions(s) / ED Diagnoses   Final diagnoses:  Paresthesias  Neck pain    ED Discharge Orders    None       Rolan BuccoBelfi, Shalandra Leu, MD 10/30/18 1745

## 2019-05-15 ENCOUNTER — Emergency Department (HOSPITAL_BASED_OUTPATIENT_CLINIC_OR_DEPARTMENT_OTHER): Payer: BLUE CROSS/BLUE SHIELD

## 2019-05-15 ENCOUNTER — Other Ambulatory Visit: Payer: Self-pay

## 2019-05-15 ENCOUNTER — Encounter (HOSPITAL_BASED_OUTPATIENT_CLINIC_OR_DEPARTMENT_OTHER): Payer: Self-pay

## 2019-05-15 ENCOUNTER — Emergency Department (HOSPITAL_BASED_OUTPATIENT_CLINIC_OR_DEPARTMENT_OTHER)
Admission: EM | Admit: 2019-05-15 | Discharge: 2019-05-15 | Disposition: A | Discharge status: Home / Self Care | Payer: BLUE CROSS/BLUE SHIELD | Attending: Emergency Medicine | Admitting: Emergency Medicine

## 2019-05-15 DIAGNOSIS — M5412 Radiculopathy, cervical region: Secondary | ICD-10-CM | POA: Diagnosis not present

## 2019-05-15 DIAGNOSIS — R200 Anesthesia of skin: Secondary | ICD-10-CM | POA: Diagnosis present

## 2019-05-15 DIAGNOSIS — Z79899 Other long term (current) drug therapy: Secondary | ICD-10-CM | POA: Diagnosis not present

## 2019-05-15 LAB — CBC
HCT: 48.5 % (ref 39.0–52.0)
Hemoglobin: 15.7 g/dL (ref 13.0–17.0)
MCH: 28.1 pg (ref 26.0–34.0)
MCHC: 32.4 g/dL (ref 30.0–36.0)
MCV: 86.9 fL (ref 80.0–100.0)
Platelets: 246 10*3/uL (ref 150–400)
RBC: 5.58 MIL/uL (ref 4.22–5.81)
RDW: 11.9 % (ref 11.5–15.5)
WBC: 10.8 10*3/uL — ABNORMAL HIGH (ref 4.0–10.5)
nRBC: 0 % (ref 0.0–0.2)

## 2019-05-15 LAB — BASIC METABOLIC PANEL
Anion gap: 9 (ref 5–15)
BUN: 10 mg/dL (ref 6–20)
CO2: 26 mmol/L (ref 22–32)
Calcium: 9.3 mg/dL (ref 8.9–10.3)
Chloride: 105 mmol/L (ref 98–111)
Creatinine, Ser: 1.08 mg/dL (ref 0.61–1.24)
GFR calc Af Amer: >60 mL/min (ref >60)
GFR calc non Af Amer: >60 mL/min (ref >60)
Glucose, Bld: 106 mg/dL — ABNORMAL HIGH (ref 70–99)
Potassium: 3.8 mmol/L (ref 3.5–5.1)
Sodium: 140 mmol/L (ref 135–145)

## 2019-05-15 MED ORDER — NAPROXEN 500 MG PO TABS: 500.0000 mg | ORAL_TABLET | Freq: Two times a day (BID) | ORAL | 0 refills | Status: DC

## 2019-05-15 MED ORDER — PREDNISONE 50 MG PO TABS: 50.0000 mg | ORAL_TABLET | Freq: Every day | ORAL | 0 refills | Status: DC

## 2019-05-15 MED ORDER — CYCLOBENZAPRINE HCL 10 MG PO TABS: 10.0000 mg | ORAL_TABLET | Freq: Two times a day (BID) | ORAL | 0 refills | Status: DC | PRN

## 2019-05-15 NOTE — ED Provider Notes (Signed)
[facility] EMERGENCY DEPARTMENT Provider Note   CSN: [id number] Arrival date & time: 05/15/19  1338    History   Chief Complaint Chief Complaint  Patient presents with  . Tingling    HPI Paul Frey is a 38 y.o. male.     HPI Patient presents to the emergency room for evaluation of numbness and tingling.  Patient states symptoms started about a week ago.  He has noticed tingling primarily in his right upper extremity but he has not noticed a little bit in his left upper extremity as well as his bilateral lower extremities.  Has had some pain and stiffness in the right side of the neck that seems to go down towards his arm.  Certain positions exacerbate pain.  He is not having any trouble with weakness.  He denies any trouble with his speech.  He denies any issues with his gait or his vision.  Patient denies any previous symptoms similar to this. Past Medical History:  Diagnosis Date  . Keloid of skin     There are no active problems to display for this patient.   Past Surgical History:  Procedure Laterality Date  . KELOID EXCISION          Home Medications    Prior to Admission medications   Medication Sig Start Date End Date Taking? Authorizing Provider  cyclobenzaprine (FLEXERIL) 10 MG tablet Take 1 tablet (10 mg total) by mouth 2 (two) times daily as needed for muscle spasms. 05/15/19   Linwood DibblesKnapp, Dorla Guizar, MD  fluticasone (FLONASE) 50 MCG/ACT nasal spray Place 2 sprays into both nostrils daily. 11/27/17   Couture, Cortni S, PA-C  naproxen (NAPROSYN) 500 MG tablet Take 1 tablet (500 mg total) by mouth 2 (two) times daily with a meal. As needed for pain 05/15/19   Linwood DibblesKnapp, Khalessi Blough, MD  predniSONE (DELTASONE) 50 MG tablet Take 1 tablet (50 mg total) by mouth daily. 05/15/19   Linwood DibblesKnapp, Armani Brar, MD    Family History No family history on file.  Social History Social History   Tobacco Use  . Smoking status: Never Smoker  . Smokeless tobacco: Never Used  Substance Use Topics   . Alcohol use: No  . Drug use: No     Allergies   Patient has no known allergies.   Review of Systems Review of Systems  All other systems reviewed and are negative.    Physical Exam Updated Vital Signs BP (!) 161/102 (BP Location: Left Arm)   Pulse 90   Temp 98.2 F (36.8 C) (Oral)   Resp 20   Ht 1.753 m (5\' 9" )   Wt 119.3 kg   SpO2 98%   BMI 38.84 kg/m   Physical Exam Vitals signs and nursing note reviewed.  Constitutional:      General: He is not in acute distress.    Appearance: He is well-developed.  HENT:     Head: Normocephalic and atraumatic.     Right Ear: External ear normal.     Left Ear: External ear normal.  Eyes:     General: No scleral icterus.       Right eye: No discharge.        Left eye: No discharge.     Conjunctiva/sclera: Conjunctivae normal.  Neck:     Musculoskeletal: Neck supple. Muscular tenderness ( Right paraspinal region) present.     Trachea: No tracheal deviation.  Cardiovascular:     Rate and Rhythm: Normal rate and regular rhythm.  Pulmonary:  Effort: Pulmonary effort is normal. No respiratory distress.     Breath sounds: Normal breath sounds. No stridor. No wheezing or rales.  Abdominal:     General: Bowel sounds are normal. There is no distension.     Palpations: Abdomen is soft.     Tenderness: There is no abdominal tenderness. There is no guarding or rebound.  Musculoskeletal:        General: No tenderness.  Skin:    General: Skin is warm and dry.     Findings: No rash.  Neurological:     Mental Status: He is alert and oriented to person, place, and time.     Cranial Nerves: No cranial nerve deficit (No facial droop, extraocular movements intact, tongue midline ).     Sensory: No sensory deficit.     Motor: No abnormal muscle tone or seizure activity.     Coordination: Coordination normal.     Comments: No pronator drift bilateral upper extrem, able to hold both legs off bed for 5 seconds, sensation intact in  all extremities, no visual field cuts, no left or right sided neglect,  no nystagmus noted       ED Treatments / Results  Labs (all labs ordered are listed, but only abnormal results are displayed) Labs Reviewed  CBC - Abnormal; Notable for the following components:      Result Value   WBC 10.8 (*)    All other components within normal limits  BASIC METABOLIC PANEL - Abnormal; Notable for the following components:   Glucose, Bld 106 (*)    All other components within normal limits    EKG None  Radiology Dg Cervical Spine Complete  Result Date: 05/15/2019 CLINICAL DATA:  Neck pain with bilateral upper extremity tingling. EXAM: CERVICAL SPINE - COMPLETE 4+ VIEW COMPARISON:  None. FINDINGS: No fracture or spondylolisthesis is noted. Minimal degenerative disc disease is noted at C4-5 with anterior osteophyte formation. No prevertebral soft tissue swelling is noted. No significant neural foraminal stenosis is noted. IMPRESSION: Minimal degenerative disc disease is noted at C4-5. No acute abnormality seen in the cervical spine. Electronically Signed   By: Lupita RaiderJames  Green Jr M.D.   On: 05/15/2019 16:11    Procedures Procedures (including critical care time)  Medications Ordered in ED Medications - No data to display   Initial Impression / Assessment and Plan / ED Course  I have reviewed the triage vital signs and the nursing notes.  Pertinent labs & imaging results that were available during my care of the patient were reviewed by me and considered in my medical decision making (see chart for details).     Patient's laboratory tests are reassuring.  No signs of anemia, electrolyte abnormality or acute kidney injury to suggest a systemic cause for the tingling.  Patient's x-rays do suggest the possibility of degenerative disc disease at C4-C5.  Neurologic exam is normal.  The bilateral nature of his symptoms argues against stroke.  Patient symptoms do suggest a cervical radiculopathy.   Plan on discharge home with a course of NSAIDs, muscle relaxants and steroids.  Discussed outpatient follow-up.  Warning signs precautions discussed.  Final Clinical Impressions(s) / ED Diagnoses   Final diagnoses:  Cervical radiculopathy    ED Discharge Orders         Ordered    naproxen (NAPROSYN) 500 MG tablet  2 times daily with meals     05/15/19 1653    cyclobenzaprine (FLEXERIL) 10 MG tablet  2 times daily  PRN     05/15/19 1653    predniSONE (DELTASONE) 50 MG tablet  Daily     05/15/19 1653           Dorie Rank, MD 05/15/19 1655

## 2019-05-15 NOTE — ED Notes (Signed)
Patient transported to X-ray 

## 2019-05-15 NOTE — ED Triage Notes (Signed)
Pt c/o tingling to bilat UE and bilat LE and right upper back  X 5 days-denies injury-NAD-steady gait

## 2019-12-15 IMAGING — DX CERVICAL SPINE - COMPLETE 4+ VIEW
6 series · 6 of 6 positions shown · non-contrast
Comparison: None.

CLINICAL DATA: Neck pain with bilateral upper extremity tingling.

EXAM:
CERVICAL SPINE - COMPLETE 4+ VIEW

[c-spine lat]
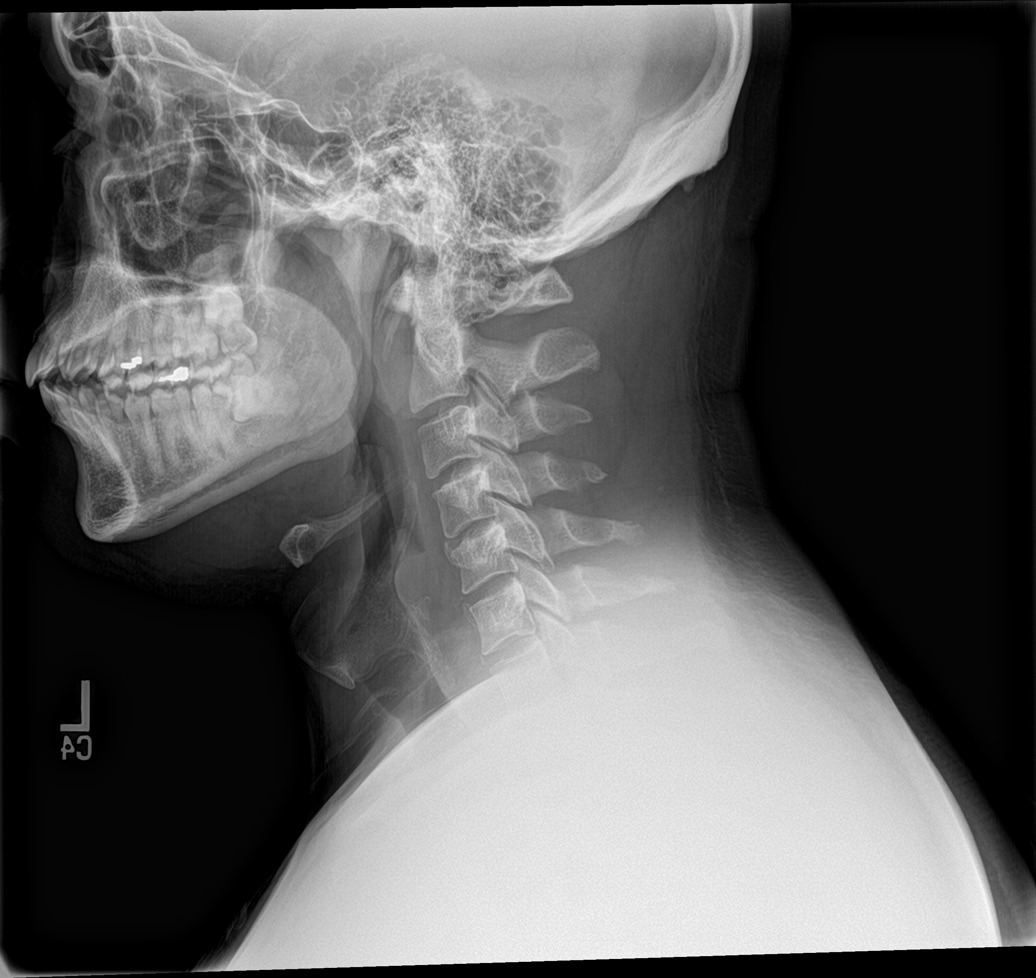

[c-spine obl (1 of 2)]
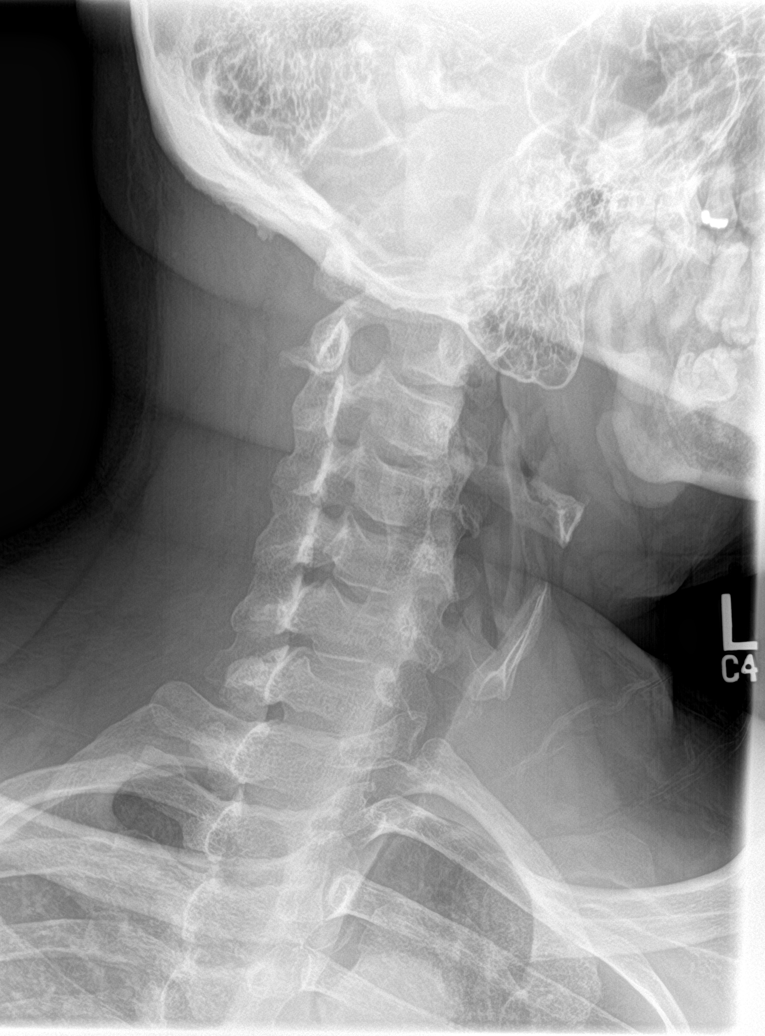

[c-spine obl (2 of 2)]
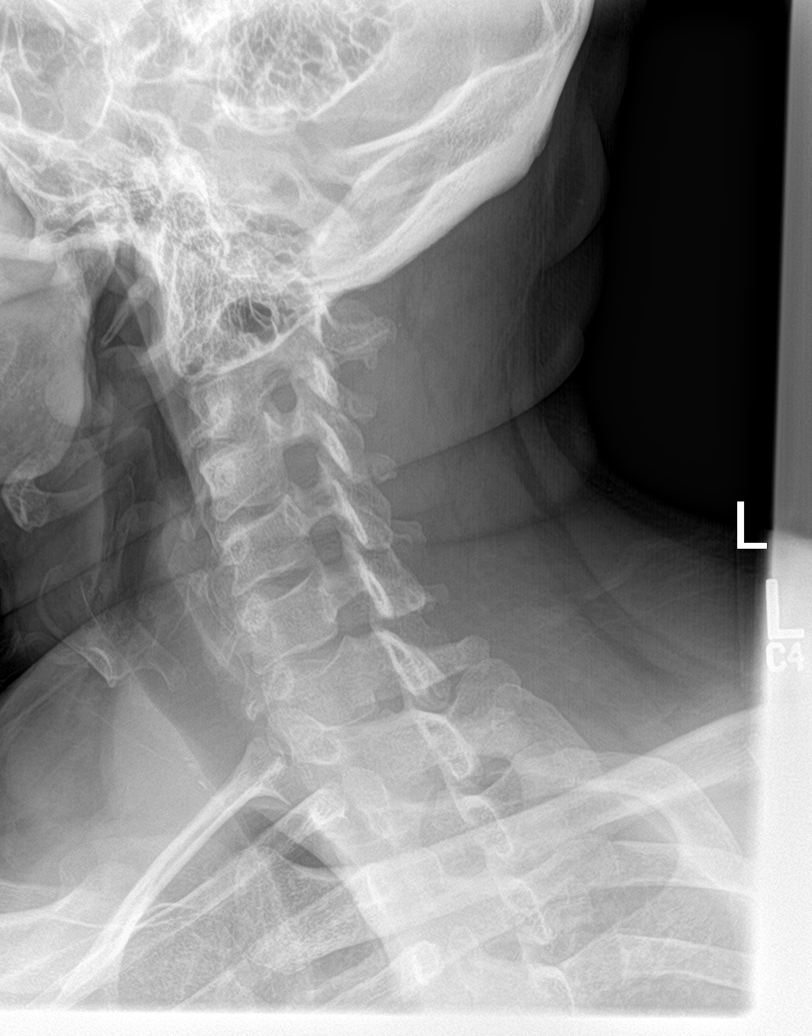

[c-spine ap]
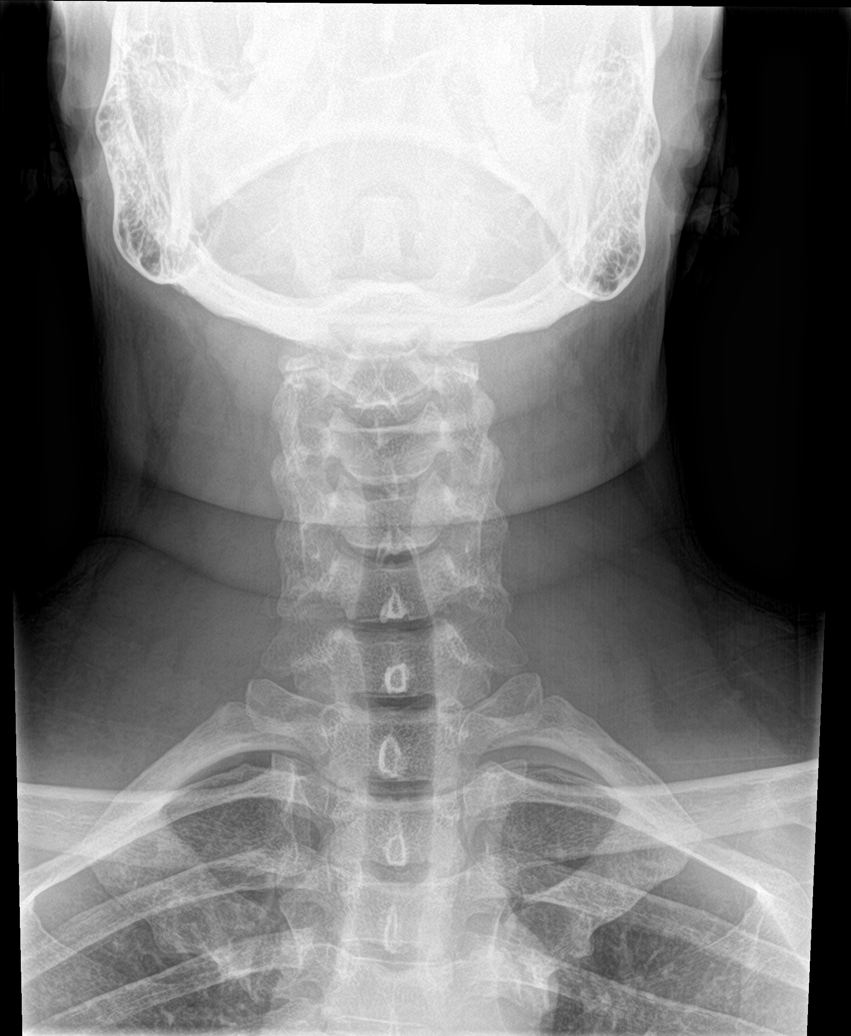

[c-spine open mouth]
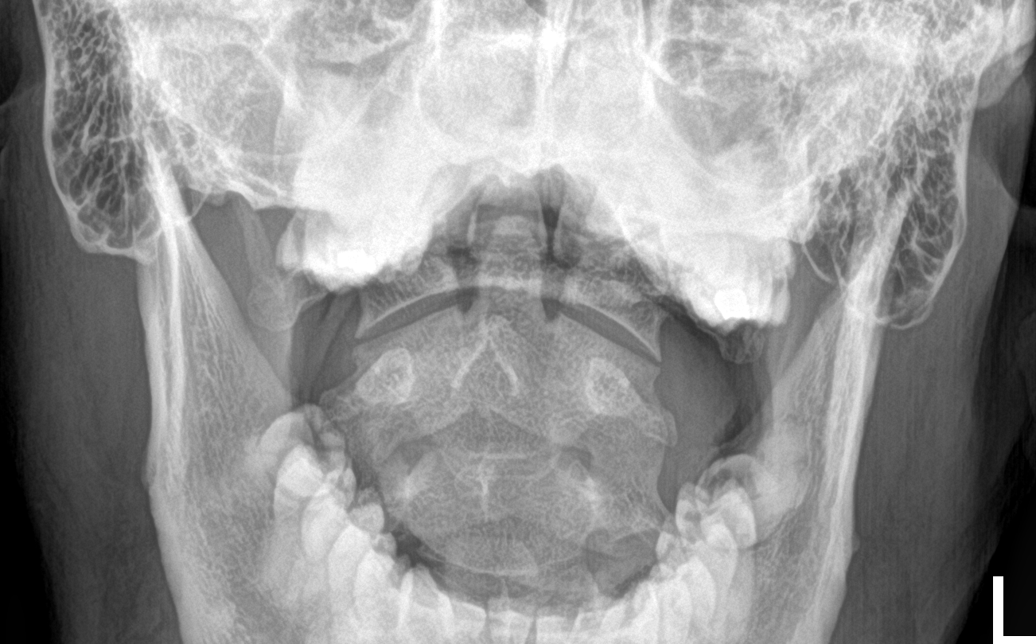

[c-spine swimmers]
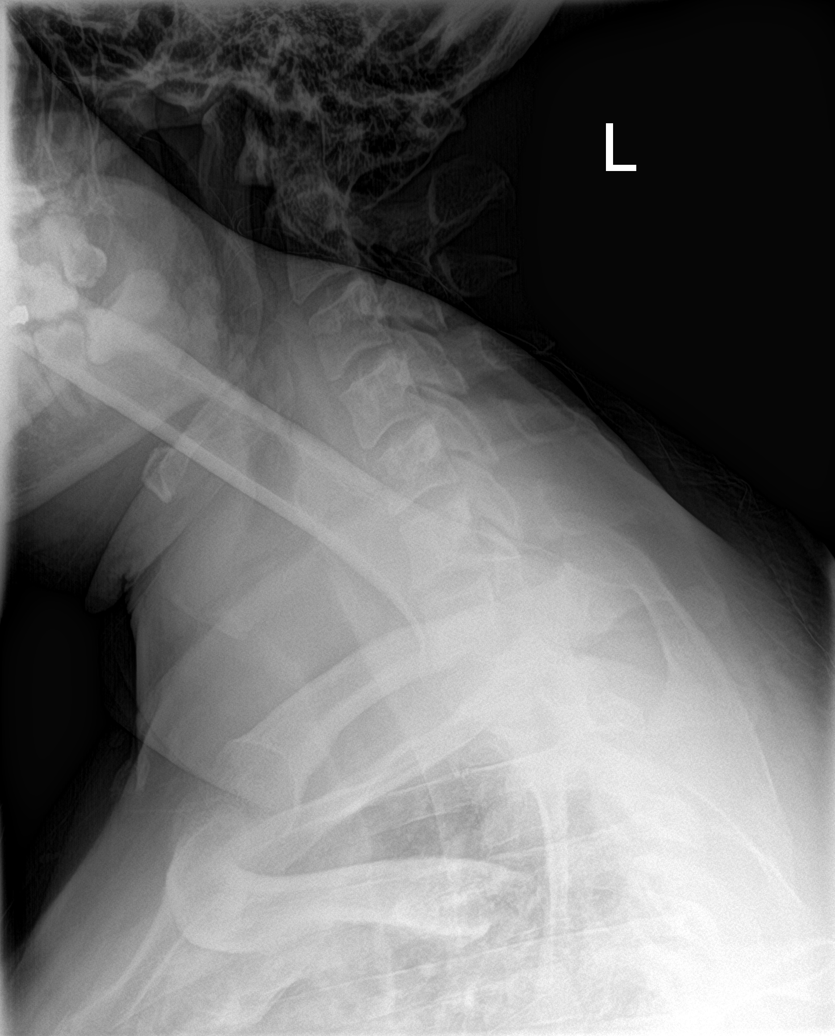

[6 of 6 positions shown; findings below may reference images not displayed]

FINDINGS: No fracture or spondylolisthesis is noted. Minimal degenerative disc
disease is noted at C4-5 with anterior osteophyte formation. No
prevertebral soft tissue swelling is noted. No significant neural
foraminal stenosis is noted.
IMPRESSION: Minimal degenerative disc disease is noted at C4-5. No acute
abnormality seen in the cervical spine.

## 2020-02-24 ENCOUNTER — Encounter (HOSPITAL_BASED_OUTPATIENT_CLINIC_OR_DEPARTMENT_OTHER): Payer: Self-pay

## 2020-02-24 ENCOUNTER — Emergency Department (HOSPITAL_BASED_OUTPATIENT_CLINIC_OR_DEPARTMENT_OTHER)
Admission: EM | Admit: 2020-02-24 | Discharge: 2020-02-24 | Disposition: A | Discharge status: Home / Self Care | Payer: BLUE CROSS/BLUE SHIELD | Attending: Emergency Medicine | Admitting: Emergency Medicine

## 2020-02-24 ENCOUNTER — Other Ambulatory Visit: Payer: Self-pay

## 2020-02-24 DIAGNOSIS — Z5321 Procedure and treatment not carried out due to patient leaving prior to being seen by health care provider: Secondary | ICD-10-CM | POA: Insufficient documentation

## 2020-02-24 DIAGNOSIS — R202 Paresthesia of skin: Secondary | ICD-10-CM | POA: Diagnosis not present

## 2020-02-24 DIAGNOSIS — R200 Anesthesia of skin: Secondary | ICD-10-CM | POA: Insufficient documentation

## 2020-02-24 HISTORY — DX: Prediabetes: R73.03

## 2020-02-24 NOTE — ED Triage Notes (Signed)
Pt c/o numbness/tingling to both hands and feet x 2 days-NAD-steady gait

## 2020-07-04 ENCOUNTER — Emergency Department (HOSPITAL_BASED_OUTPATIENT_CLINIC_OR_DEPARTMENT_OTHER)
Admission: EM | Admit: 2020-07-04 | Discharge: 2020-07-05 | Disposition: A | Discharge status: Home / Self Care | Payer: BLUE CROSS/BLUE SHIELD | Attending: Emergency Medicine | Admitting: Emergency Medicine

## 2020-07-04 ENCOUNTER — Other Ambulatory Visit: Payer: Self-pay

## 2020-07-04 ENCOUNTER — Encounter (HOSPITAL_BASED_OUTPATIENT_CLINIC_OR_DEPARTMENT_OTHER): Payer: Self-pay | Admitting: *Deleted

## 2020-07-04 ENCOUNTER — Emergency Department (HOSPITAL_BASED_OUTPATIENT_CLINIC_OR_DEPARTMENT_OTHER): Payer: BLUE CROSS/BLUE SHIELD

## 2020-07-04 DIAGNOSIS — R7303 Prediabetes: Secondary | ICD-10-CM | POA: Diagnosis not present

## 2020-07-04 DIAGNOSIS — Z79899 Other long term (current) drug therapy: Secondary | ICD-10-CM | POA: Insufficient documentation

## 2020-07-04 DIAGNOSIS — R0602 Shortness of breath: Secondary | ICD-10-CM | POA: Diagnosis present

## 2020-07-04 DIAGNOSIS — R0981 Nasal congestion: Secondary | ICD-10-CM | POA: Insufficient documentation

## 2020-07-04 DIAGNOSIS — Z7984 Long term (current) use of oral hypoglycemic drugs: Secondary | ICD-10-CM | POA: Insufficient documentation

## 2020-07-04 DIAGNOSIS — Z20822 Contact with and (suspected) exposure to covid-19: Secondary | ICD-10-CM | POA: Diagnosis not present

## 2020-07-04 LAB — SARS CORONAVIRUS 2 BY RT PCR (HOSPITAL ORDER, PERFORMED IN ~~LOC~~ HOSPITAL LAB): SARS Coronavirus 2: NEGATIVE

## 2020-07-04 NOTE — ED Notes (Signed)
States he has a lot of nasal congestion, more in the AM, denies facial pain, denies HA, when ambulated to room, POX was monitored, pt remained at 97% on RA. Did not demonstrate dyspnea or tachypnea or any resp distress when ambulating to room.

## 2020-07-04 NOTE — ED Triage Notes (Addendum)
Pt reports "breathing hard" today. He had his first covid vaccine a week ago. NAD. Reports dizziness every morning x 6 days which resolves. Also c/o sinus pressure

## 2020-07-05 MED ORDER — ALBUTEROL SULFATE HFA 108 (90 BASE) MCG/ACT IN AERS
2.0000 | INHALATION_SPRAY | RESPIRATORY_TRACT | Status: DC | PRN
  Administered 2020-07-05: 2 via RESPIRATORY_TRACT
  Filled 2020-07-05: qty 6.7

## 2020-07-05 NOTE — ED Provider Notes (Signed)
[facility] Provider Note: Lowella Dell, MD, FACEP  CSN: [id number] MRN: [id number] ARRIVAL: 07/04/20 at 1854 ROOM: [facility]   CHIEF COMPLAINT  Shortness of Breath   HISTORY OF PRESENT ILLNESS  07/05/20 1:11 AM Paul Frey is a 39 y.o. male who states he was breathing rapidly yesterday evening and came to the ED.  He is not sure what triggered this and he now feels back to normal.  He does not know if he was wheezing.  He had his first Covid vaccine a week ago.  Symptoms are moderate.  He has been having lightheadedness every morning for the past 6 days which then resolved.  He attributes this to sinus pressure and nasal congestion which improved with an unspecified over-the-counter nasal spray.  He is having some back spasms now but otherwise has no acute complaints.   Past Medical History:  Diagnosis Date  . Keloid of skin   . Prediabetes     Past Surgical History:  Procedure Laterality Date  . KELOID EXCISION      No family history on file.  Social History   Tobacco Use  . Smoking status: Never Smoker  . Smokeless tobacco: Never Used  Vaping Use  . Vaping Use: Never used  Substance Use Topics  . Alcohol use: No  . Drug use: No    Prior to Admission medications   Medication Sig Start Date End Date Taking? Authorizing Provider  metFORMIN (GLUCOPHAGE-XR) 500 MG 24 hr tablet TAKE 1 TABLET(500 MG) BY MOUTH TWICE DAILY WITH MEALS 04/06/20  Yes [provider]  fluticasone (FLONASE) 50 MCG/ACT nasal spray Place 2 sprays into both nostrils daily. 11/27/17   Couture, Cortni S, PA-C    Allergies Patient has no known allergies.   REVIEW OF SYSTEMS  Negative except as noted here or in the History of Present Illness.   PHYSICAL EXAMINATION  Initial Vital Signs Blood pressure 124/69, pulse 67, temperature 98.4 F (36.9 C), temperature source Oral, resp. rate 18, height 5\' 10"  (1.778 m), weight 117.9 kg, SpO2 98 %.  Examination General:  Well-developed, well-nourished male in no acute distress; appearance consistent with age of record HENT: normocephalic; atraumatic; nasal congestion Eyes: Normal appearance Neck: supple Heart: regular rate and rhythm Lungs: clear to auscultation bilaterally Abdomen: soft; nondistended; nontender; bowel sounds present Extremities: No deformity; full range of motion Neurologic: Awake, alert and oriented; motor function intact in all extremities and symmetric; no facial droop Skin: Warm and dry Psychiatric: Normal mood and affect   RESULTS  Summary of this visit's results, reviewed and interpreted by myself:   EKG Interpretation  Date/Time:  Saturday July 04 2020 22:37:17 EDT Ventricular Rate:  66 PR Interval:  184 QRS Duration: 112 QT Interval:  392 QTC Calculation: 410 R Axis:   25 Text Interpretation: Normal sinus rhythm Cannot rule out Anterior infarct , age undetermined Abnormal ECG No significant change was found Confirmed by [id number] (Paula Libra) on 07/04/2020 10:44:43 PM      Laboratory Studies: Results for orders placed or performed during the hospital encounter of 07/04/20 (from the past 24 hour(s))  SARS Coronavirus 2 by RT PCR (hospital order, performed in [facility] hospital lab) Nasopharyngeal Nasopharyngeal Swab     Status: None   Collection Time: 07/04/20  7:54 PM   Specimen: Nasopharyngeal Swab  Result Value Ref Range   SARS Coronavirus 2 NEGATIVE NEGATIVE   Imaging Studies: DG Chest 2 View  Result Date: 07/04/2020 CLINICAL DATA:  Dyspnea EXAM:  CHEST - 2 VIEW COMPARISON:  11/26/2017 FINDINGS: Lungs are well expanded, symmetric, and clear. No pneumothorax or pleural effusion. Cardiac size within normal limits. Pulmonary vascularity is normal. Osseous structures are age-appropriate. No acute bone abnormality. IMPRESSION: No active cardiopulmonary disease. Electronically Signed   By: Helyn Numbers MD   On: 07/04/2020 23:12    ED COURSE and MDM  Nursing notes,  initial and subsequent vitals signs, including pulse oximetry, reviewed and interpreted by myself.  Vitals:   07/04/20 1949 07/04/20 2232 07/04/20 2355 07/05/20 0100  BP:  (!) 149/76 (!) 136/92 124/69  Pulse:  78 66 67  Resp:  18 17 18   Temp:  98.3 F (36.8 C) 98.4 F (36.9 C)   TempSrc:  Oral Oral   SpO2:  98% 98% 98%  Weight: 117.9 kg     Height: 5\' 10"  (1.778 m)      Medications  albuterol (VENTOLIN HFA) 108 (90 Base) MCG/ACT inhaler 2 puff (has no administration in time range)    Patient's Covid is negative and he has a clear chest x-ray.  He has an unremarkable exam at the present time.  He may have had some bronchospasm earlier.  We will provide an albuterol inhaler should symptoms recur.  PROCEDURES  Procedures   ED DIAGNOSES     ICD-10-CM   1. Shortness of breath  R06.02   2. Nasal congestion  R09.81        , MD 07/05/20 [phone]

## 2020-09-26 ENCOUNTER — Other Ambulatory Visit: Payer: Self-pay

## 2020-09-26 ENCOUNTER — Emergency Department (HOSPITAL_BASED_OUTPATIENT_CLINIC_OR_DEPARTMENT_OTHER)
Admission: EM | Admit: 2020-09-26 | Discharge: 2020-09-26 | Disposition: A | Discharge status: Home / Self Care | Payer: BLUE CROSS/BLUE SHIELD | Attending: Emergency Medicine | Admitting: Emergency Medicine

## 2020-09-26 ENCOUNTER — Encounter (HOSPITAL_BASED_OUTPATIENT_CLINIC_OR_DEPARTMENT_OTHER): Payer: Self-pay

## 2020-09-26 DIAGNOSIS — M5442 Lumbago with sciatica, left side: Secondary | ICD-10-CM | POA: Diagnosis not present

## 2020-09-26 DIAGNOSIS — M545 Low back pain, unspecified: Secondary | ICD-10-CM | POA: Diagnosis present

## 2020-09-26 MED ORDER — PREDNISONE 20 MG PO TABS: 40.0000 mg | ORAL_TABLET | Freq: Every day | ORAL | 0 refills | Status: AC

## 2020-09-26 MED ORDER — KETOROLAC TROMETHAMINE 60 MG/2ML IM SOLN
60.0000 mg | Freq: Once | INTRAMUSCULAR | Status: AC
  Administered 2020-09-26: 60 mg via INTRAMUSCULAR
  Filled 2020-09-26: qty 2

## 2020-09-26 MED ORDER — METHOCARBAMOL 500 MG PO TABS: 500.0000 mg | ORAL_TABLET | Freq: Two times a day (BID) | ORAL | 0 refills | Status: DC

## 2020-09-26 NOTE — Discharge Instructions (Signed)
You can take Tylenol or Ibuprofen as directed for pain. You can alternate Tylenol and Ibuprofen every 4 hours. If you take Tylenol at 1pm, then you can take Ibuprofen at 5pm. Then you can take Tylenol again at 9pm.   Take Robaxin as prescribed. This medication will make you drowsy so do not drive or drink alcohol when taking it.  Take prednisone as directed.  Use topical icy hot to help with pain.  Return to the Emergency Department immediately for any worsening back pain, neck pain, difficulty walking, numbness/weaknss of your arms or legs, urinary or bowel accidents, fever or any other worsening or concerning symptoms.

## 2020-09-26 NOTE — ED Triage Notes (Signed)
Pt report lower back pain since today at work while moving boxes. Pt reports he has injured his back before.

## 2020-09-26 NOTE — ED Provider Notes (Signed)
[facility] EMERGENCY DEPARTMENT Provider Note   CSN: [id number] Arrival date & time: 09/26/20  1729     History Chief Complaint  Patient presents with  . Back Pain    Paul Frey is a 39 y.o. male possible history of keloid, prediabetes (on Metformin) who presents for evaluation of low back pain, left greater than right that began today.  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  He states that he picked up something heavy and felt pain in his lower back.  He states since then, he has had pain in the lower back.  He took Advil with no improvement.  He states that the pain is worse when he attempts to move, bend and when he moves his left leg.  He denies any fall.  He has had back pain before but states this has been worse.  He is still been able ambulate. Denies fevers, weight loss, numbness/weakness of upper and lower extremities, bowel/bladder incontinence, saddle anesthesia, history of back surgery, history of IVDA.   The history is provided by the patient.       Past Medical History:  Diagnosis Date  . Keloid of skin   . Prediabetes     There are no problems to display for this patient.   Past Surgical History:  Procedure Laterality Date  . KELOID EXCISION         History reviewed. No pertinent family history.  Social History   Tobacco Use  . Smoking status: Never Smoker  . Smokeless tobacco: Never Used  Vaping Use  . Vaping Use: Never used  Substance Use Topics  . Alcohol use: No  . Drug use: No    Home Medications Prior to Admission medications   Medication Sig Start Date End Date Taking? Authorizing Provider  fluticasone (FLONASE) 50 MCG/ACT nasal spray Place 2 sprays into both nostrils daily. 11/27/17   Couture, Cortni S, PA-C  metFORMIN (GLUCOPHAGE-XR) 500 MG 24 hr tablet TAKE 1 TABLET(500 MG) BY MOUTH TWICE DAILY WITH MEALS 04/06/20   [provider]  methocarbamol (ROBAXIN) 500 MG tablet Take 1 tablet (500  mg total) by mouth 2 (two) times daily. 09/26/20   Maxwell Caul, PA-C  predniSONE (DELTASONE) 20 MG tablet Take 2 tablets (40 mg total) by mouth daily for 4 days. 09/26/20 09/30/20  Maxwell Caul, PA-C    Allergies    Patient has no known allergies.  Review of Systems   Review of Systems  Constitutional: Negative for fever.  Musculoskeletal: Positive for back pain. Negative for neck pain.  Neurological: Negative for weakness, numbness and headaches.  All other systems reviewed and are negative.   Physical Exam Updated Vital Signs BP (!) 148/96 (BP Location: Right Arm)   Pulse 83   Temp 98.4 F (36.9 C) (Oral)   Resp 18   Ht 5\' 10"  (1.778 m)   Wt 117.9 kg   SpO2 99%   BMI 37.31 kg/m   Physical Exam Vitals and nursing note reviewed.  Constitutional:      Appearance: He is well-developed.  HENT:     Head: Normocephalic and atraumatic.  Eyes:     General: No scleral icterus.       Right eye: No discharge.        Left eye: No discharge.     Conjunctiva/sclera: Conjunctivae normal.  Neck:     Comments: Full flexion/extension and lateral movement of neck fully intact. No bony midline tenderness.  No deformities or crepitus.  Pulmonary:     Effort: Pulmonary effort is normal.  Musculoskeletal:     Comments: No midline C-spine tenderness.  No step-offs or deformities noted.  He has diffuse lumbar lower lumbar tenderness extends over to the midline.  Most of his tenderness is in the paraspinal muscles of the left lower region that extends to the gluteus. He has some overlying spasm.  Skin:    General: Skin is warm and dry.  Neurological:     Mental Status: He is alert.     Comments: Follows commands, Moves all extremities  5/5 strength to BUE and BLE  Sensation intact throughout all major nerve distributions Positive straight leg raise on left lower extremity.  Psychiatric:        Speech: Speech normal.        Behavior: Behavior normal.     ED Results /  Procedures / Treatments   Labs (all labs ordered are listed, but only abnormal results are displayed) Labs Reviewed - No data to display  EKG None  Radiology No results found.  Procedures Procedures (including critical care time)  Medications Ordered in ED Medications  ketorolac (TORADOL) injection 60 mg (60 mg Intramuscular Given 09/26/20 1852)    ED Course  I have reviewed the triage vital signs and the nursing notes.  Pertinent labs & imaging results that were available during my care of the patient were reviewed by me and considered in my medical decision making (see chart for details).    MDM Rules/Calculators/A&P                          39 year old male who presents for evaluation of lower back pain that began today while lifting a heavy box.  States that he has pain when he moves, tries to bend.  He is still been able to ambulate.  No numbness/weakness, urinary or bowel continence.  On initially arrival, she is afebrile, toxic appearing.  Vital signs are stable.  On exam, normal strength/sensation noted to bilateral upper and lower extremities.  He does have positive straight leg raise on left lower extremity.  Suspect that this is lower back pain with sciatica secondary to mechanical back pain.  History/physical exam not concerning for cauda equina, spinal abscess.  Additionally, no indication for x-ray imaging.  He had no fall.  will give analgesics here in the ED.  He has had reassuring blood work in September 2021.  We will plan to send him home on muscle relaxers, short course of prednisone.  Encouraged at home supportive care measures. Patient had ample opportunity for questions and discussion. All patient's questions were answered with full understanding. Strict return precautions discussed. Patient expresses understanding and agreement to plan.   Portions of this note were generated with Scientist, clinical (histocompatibility and immunogenetics). Dictation errors may occur despite best attempts at  proofreading.  Final Clinical Impression(s) / ED Diagnoses Final diagnoses:  Acute left-sided low back pain with left-sided sciatica    Rx / DC Orders ED Discharge Orders         Ordered    predniSONE (DELTASONE) 20 MG tablet  Daily        09/26/20 1846    methocarbamol (ROBAXIN) 500 MG tablet  2 times daily        09/26/20 1846           Maxwell Caul, PA-C 09/26/20 1919    Little, Ambrose Finland, MD 09/27/20 [phone]

## 2021-02-03 IMAGING — CR DG CHEST 2V
2 series · 2 of 2 positions shown · non-contrast
Comparison: 11/26/2017

CLINICAL DATA: Dyspnea

EXAM:
CHEST - 2 VIEW

[w chest pa]
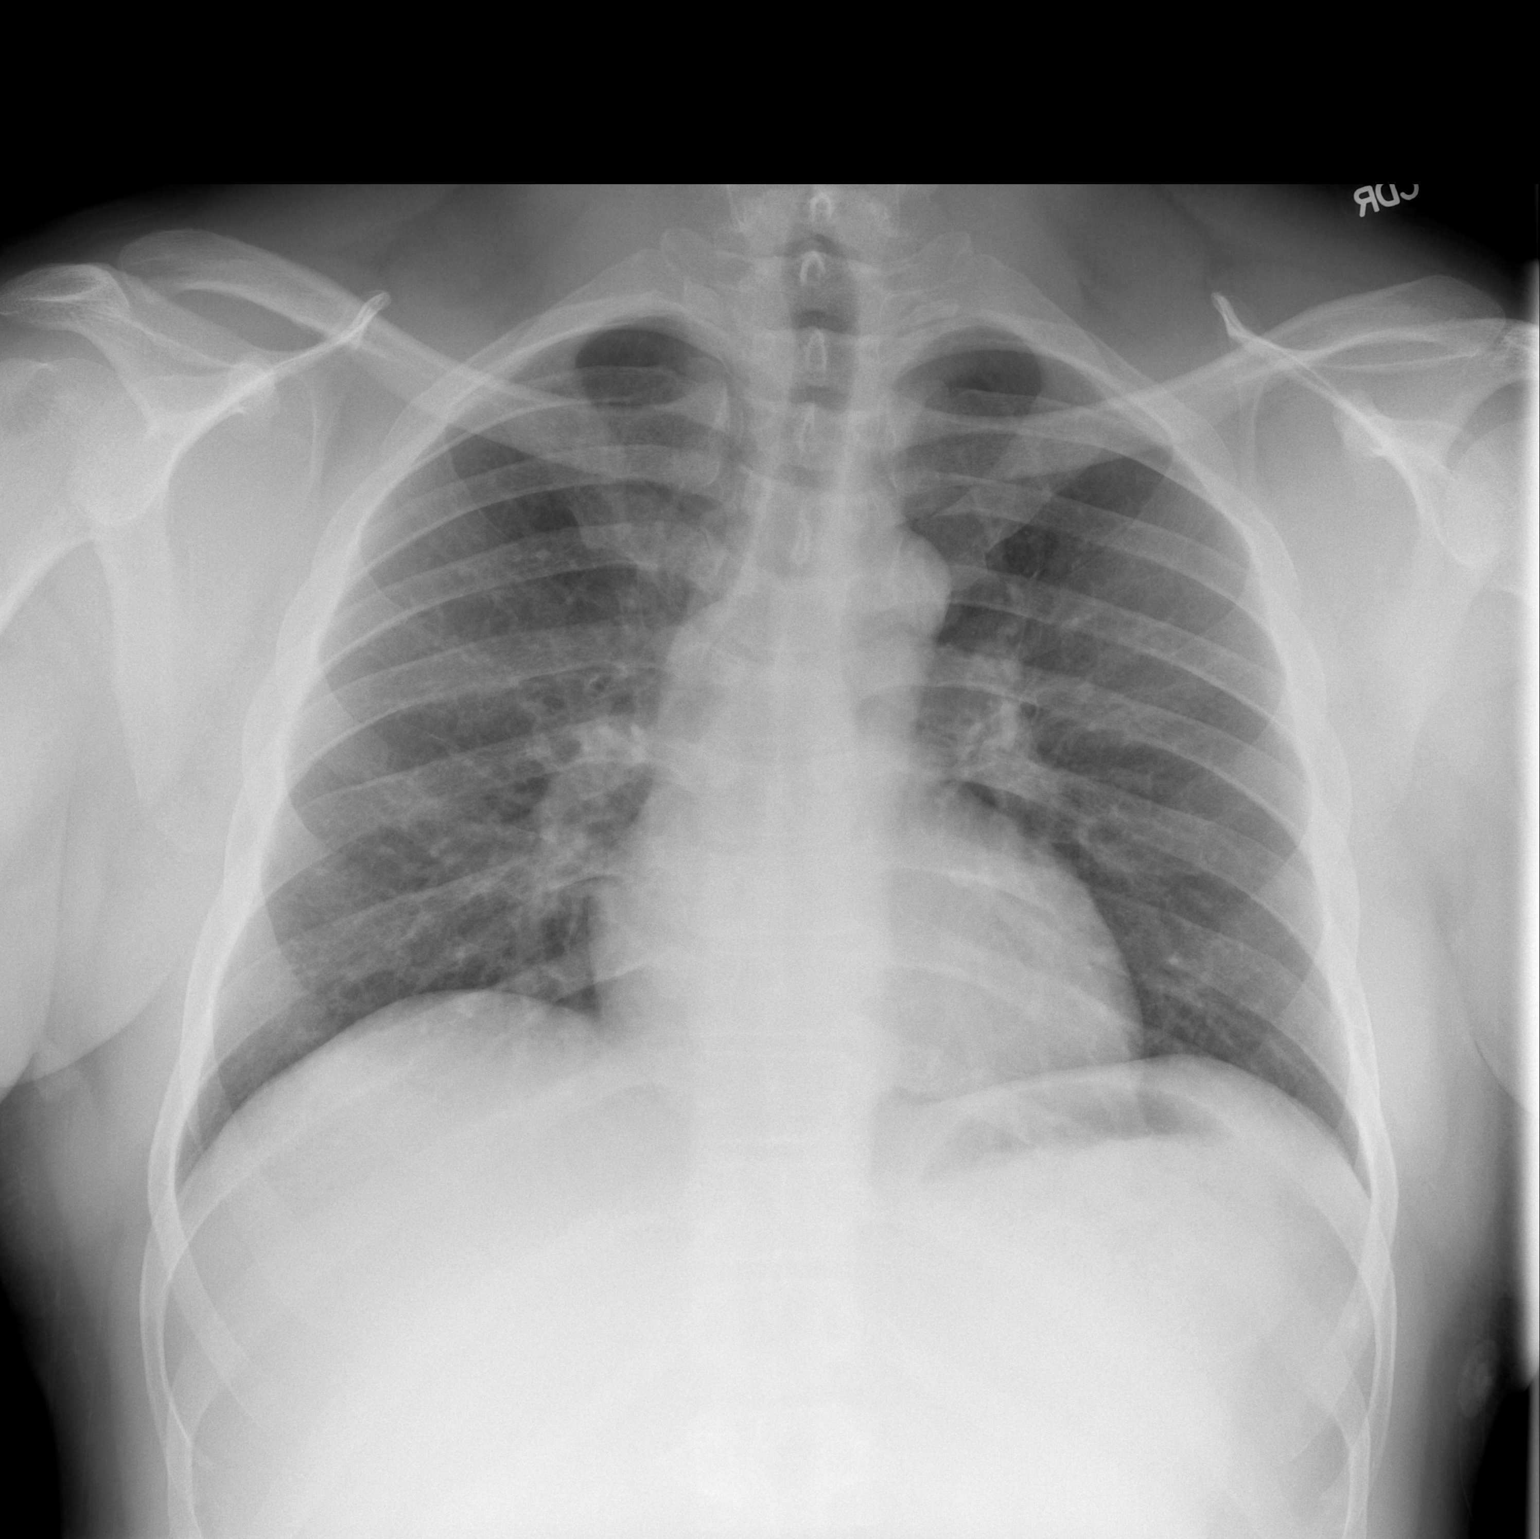

[w chest lat]
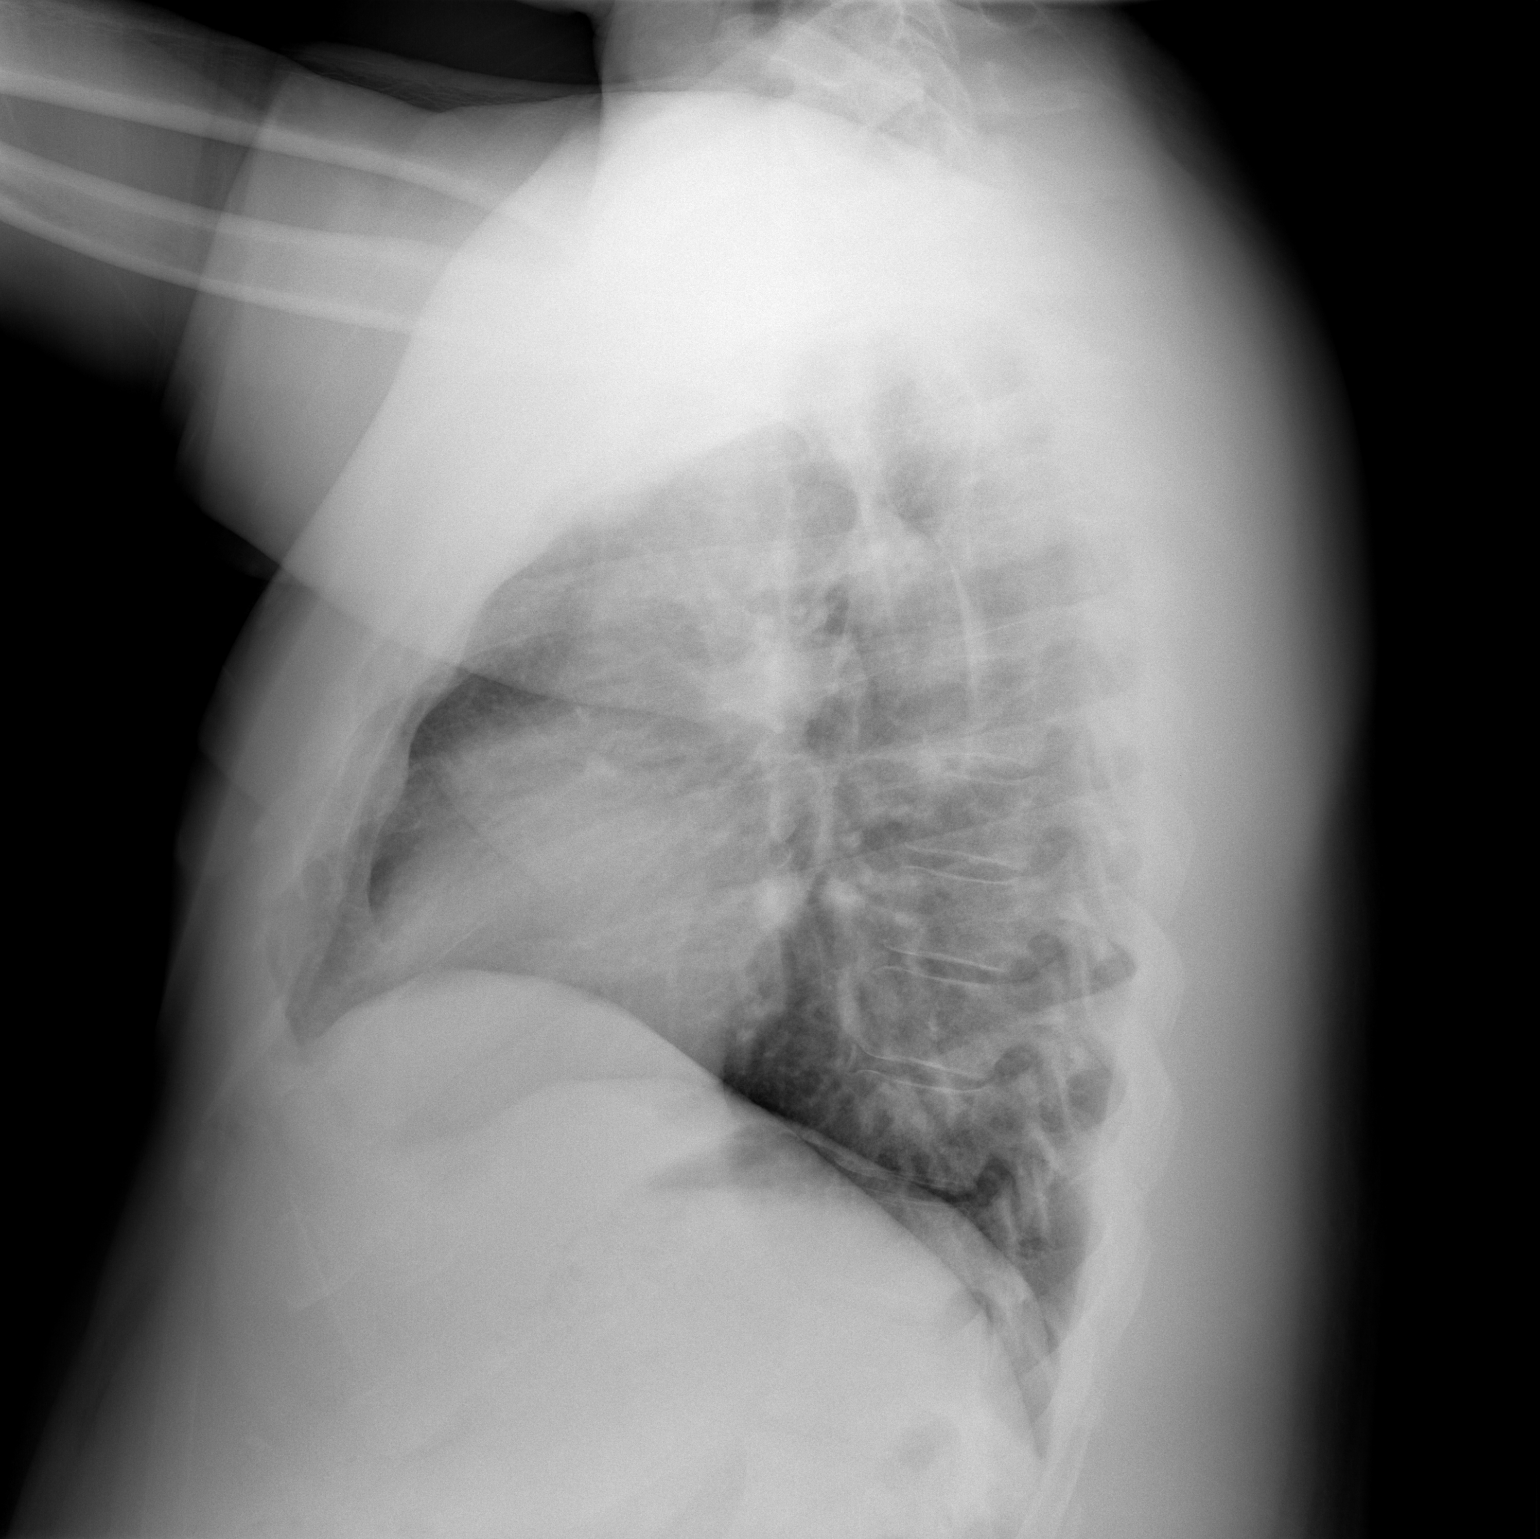

[2 of 2 positions shown; findings below may reference images not displayed]

FINDINGS: Lungs are well expanded, symmetric, and clear. No pneumothorax or
pleural effusion. Cardiac size within normal limits. Pulmonary
vascularity is normal. Osseous structures are age-appropriate. No
acute bone abnormality.
IMPRESSION: No active cardiopulmonary disease.

## 2022-07-26 ENCOUNTER — Other Ambulatory Visit: Payer: Self-pay

## 2022-07-26 ENCOUNTER — Emergency Department (HOSPITAL_BASED_OUTPATIENT_CLINIC_OR_DEPARTMENT_OTHER): Payer: Self-pay

## 2022-07-26 ENCOUNTER — Emergency Department (HOSPITAL_BASED_OUTPATIENT_CLINIC_OR_DEPARTMENT_OTHER)
Admission: EM | Admit: 2022-07-26 | Discharge: 2022-07-26 | Disposition: A | Discharge status: Home / Self Care | Payer: Self-pay | Attending: Emergency Medicine | Admitting: Emergency Medicine

## 2022-07-26 ENCOUNTER — Encounter (HOSPITAL_BASED_OUTPATIENT_CLINIC_OR_DEPARTMENT_OTHER): Payer: Self-pay | Admitting: Emergency Medicine

## 2022-07-26 DIAGNOSIS — R079 Chest pain, unspecified: Secondary | ICD-10-CM

## 2022-07-26 DIAGNOSIS — Z7984 Long term (current) use of oral hypoglycemic drugs: Secondary | ICD-10-CM | POA: Insufficient documentation

## 2022-07-26 DIAGNOSIS — I10 Essential (primary) hypertension: Secondary | ICD-10-CM | POA: Insufficient documentation

## 2022-07-26 DIAGNOSIS — E119 Type 2 diabetes mellitus without complications: Secondary | ICD-10-CM | POA: Insufficient documentation

## 2022-07-26 DIAGNOSIS — R0789 Other chest pain: Secondary | ICD-10-CM | POA: Insufficient documentation

## 2022-07-26 HISTORY — DX: Essential (primary) hypertension: I10

## 2022-07-26 HISTORY — DX: Type 2 diabetes mellitus without complications: E11.9

## 2022-07-26 LAB — CBC
HCT: 42.3 % (ref 39.0–52.0)
Hemoglobin: 14.4 g/dL (ref 13.0–17.0)
MCH: 28.2 pg (ref 26.0–34.0)
MCHC: 34 g/dL (ref 30.0–36.0)
MCV: 82.8 fL (ref 80.0–100.0)
Platelets: 240 10*3/uL (ref 150–400)
RBC: 5.11 MIL/uL (ref 4.22–5.81)
RDW: 11.9 % (ref 11.5–15.5)
WBC: 8.7 10*3/uL (ref 4.0–10.5)
nRBC: 0 % (ref 0.0–0.2)

## 2022-07-26 LAB — BASIC METABOLIC PANEL
Anion gap: 6 (ref 5–15)
BUN: 9 mg/dL (ref 6–20)
CO2: 26 mmol/L (ref 22–32)
Calcium: 9.1 mg/dL (ref 8.9–10.3)
Chloride: 106 mmol/L (ref 98–111)
Creatinine, Ser: 1.21 mg/dL (ref 0.61–1.24)
GFR, Estimated: >60 mL/min (ref >60)
Glucose, Bld: 139 mg/dL — ABNORMAL HIGH (ref 70–99)
Potassium: 3.8 mmol/L (ref 3.5–5.1)
Sodium: 138 mmol/L (ref 135–145)

## 2022-07-26 LAB — TROPONIN I (HIGH SENSITIVITY): Troponin I (High Sensitivity): 4 ng/L (ref <18)

## 2022-07-26 MED ORDER — METHOCARBAMOL 500 MG PO TABS: 500.0000 mg | ORAL_TABLET | Freq: Two times a day (BID) | ORAL | 0 refills | Status: DC

## 2022-07-26 NOTE — ED Provider Notes (Signed)
[facility] EMERGENCY DEPARTMENT Provider Note   CSN: [id number] Arrival date & time: 07/26/22  1239     History  Chief Complaint  Patient presents with   Chest Pain    Paul Frey is a 41 y.o. male.   Chest Pain Patient presents with chest pain.  For around a week is had pain on the right side of his chest.  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  States feels a little tightness.  Worse lying down.  Worse with certain positions.  Not worse with exertion.  No cough.  States he does feel fatigued at times.  No cough.  Has not really had pains like this before.  Pain is more on the right upper side of his chest.    Past Medical History:  Diagnosis Date   Diabetes mellitus without complication ([place])    Hypertension    Keloid of skin    Prediabetes     Home Medications Prior to Admission medications   Medication Sig Start Date End Date Taking? Authorizing Provider  fluticasone (FLONASE) 50 MCG/ACT nasal spray Place 2 sprays into both nostrils daily. 11/27/17   Couture, Cortni S, PA-C  metFORMIN (GLUCOPHAGE-XR) 500 MG 24 hr tablet TAKE 1 TABLET(500 MG) BY MOUTH TWICE DAILY WITH MEALS 04/06/20   [provider]  methocarbamol (ROBAXIN) 500 MG tablet Take 1 tablet (500 mg total) by mouth 2 (two) times daily. 07/26/22   Davonna Belling, MD      Allergies    Patient has no known allergies.    Review of Systems   Review of Systems  Cardiovascular:  Positive for chest pain.    Physical Exam Updated Vital Signs BP 123/73   Pulse 82   Temp 98.9 F (37.2 C) (Oral)   Resp 15   Ht 5\' 10"  (1.778 m)   Wt 117.9 kg   SpO2 95%   BMI 37.31 kg/m  Physical Exam Vitals reviewed.  Cardiovascular:     Rate and Rhythm: Normal rate and regular rhythm.  Pulmonary:     Effort: No respiratory distress.     Breath sounds: No wheezing or rhonchi.  Chest:     Chest wall: Tenderness present.     Comments: Tenderness right lateral anterior chest  wall. Abdominal:     Tenderness: There is no abdominal tenderness.  Musculoskeletal:     Cervical back: Neck supple.     Right lower leg: No edema.     Left lower leg: No edema.  Neurological:     Mental Status: He is alert.     ED Results / Procedures / Treatments   Labs (all labs ordered are listed, but only abnormal results are displayed) Labs Reviewed  BASIC METABOLIC PANEL - Abnormal; Notable for the following components:      Result Value   Glucose, Bld 139 (*)    All other components within normal limits  CBC  TROPONIN I ([place] SENSITIVITY)    EKG EKG Interpretation  Date/Time:  Tuesday July 26 2022 12:50:11 EDT Ventricular Rate:  78 PR Interval:  174 QRS Duration: 114 QT Interval:  378 QTC Calculation: 430 R Axis:   8 Text Interpretation: Normal sinus rhythm Minimal voltage criteria for LVH, may be normal variant ( Cornell product ) Possible Anterior infarct , age undetermined Abnormal ECG When compared with ECG of 04-Jul-2020 22:37, No significant change since last tracing Confirmed by Davonna Belling [id number]) on 07/26/2022 12:53:41 PM  Radiology No results found.  Procedures Procedures    Medications Ordered in ED Medications - No data to display  ED Course/ Medical Decision Making/ A&P                           Medical Decision Making Amount and/or Complexity of Data Reviewed Labs: ordered. Radiology: ordered.  Risk Prescription drug management.   Patient with pain.  Right anterior chest.  Differential diagnosis is long and includes life-threatening conditions such as coronary artery disease/MI, pneumothorax, pneumonia.  Will get chest x-ray EKG and blood work. Work-up is reassuring.  Lab work negative.  Negative troponins.  X-ray reassuring.  Appears stable will discharge home.  Think low risk for cardiac etiology of the pain.       Final Clinical Impression(s) / ED Diagnoses Final diagnoses:  Nonspecific chest pain    Rx / DC  Orders ED Discharge Orders          Ordered    methocarbamol (ROBAXIN) 500 MG tablet  2 times daily        07/26/22 1404              Benjiman Core, MD 07/29/22 [phone]

## 2022-07-26 NOTE — ED Triage Notes (Signed)
Pt reports upper RT side CP x 1 wk; describes as "someone grabbing me"; worse when lying down

## 2023-06-10 ENCOUNTER — Ambulatory Visit
Admission: RE | Admit: 2023-06-10 | Discharge: 2023-06-10 | Disposition: A | Discharge status: Home / Self Care | Payer: 59 | Source: Ambulatory Visit | Attending: Family Medicine | Admitting: Family Medicine

## 2023-06-10 ENCOUNTER — Other Ambulatory Visit: Payer: Self-pay

## 2023-06-10 VITALS — BP 158/92 | HR 74 | Temp 98.2°F | Resp 18 | Ht 70.0 in | Wt 250.0 lb

## 2023-06-10 DIAGNOSIS — S39012A Strain of muscle, fascia and tendon of lower back, initial encounter: Secondary | ICD-10-CM

## 2023-06-10 DIAGNOSIS — M5431 Sciatica, right side: Secondary | ICD-10-CM | POA: Diagnosis not present

## 2023-06-10 MED ORDER — PREDNISONE 10 MG (21) PO TBPK: ORAL_TABLET | Freq: Every day | ORAL | 0 refills | Status: AC

## 2023-06-10 MED ORDER — METHYLPREDNISOLONE SODIUM SUCC 125 MG IJ SOLR
125.0000 mg | Freq: Once | INTRAMUSCULAR | Status: AC
  Administered 2023-06-10: 125 mg via INTRAMUSCULAR

## 2023-06-10 NOTE — Discharge Instructions (Addendum)
Advised patient to take medication as directed with food to completion.  Encouraged to increase daily water intake to 64 ounces per day while taking this medication.  Advised if symptoms worsen and/or unresolved please follow-up PCP or here for further evaluation.

## 2023-06-10 NOTE — ED Provider Notes (Signed)
[facility]    CSN: [id number] Arrival date & time: 06/10/23  1407      History   Chief Complaint Chief Complaint  Patient presents with   Hip Pain    Entered by patient    HPI Paul Frey is a 42 y.o. male.   HPI PMH significant for morbid obesity, T2DM without complication, and HTN.  Past Medical History:  Diagnosis Date   Diabetes mellitus without complication ([facility])    Hypertension    Keloid of skin    Prediabetes     There are no problems to display for this patient.   Past Surgical History:  Procedure Laterality Date   KELOID EXCISION         Home Medications    Prior to Admission medications   Medication Sig Start Date End Date Taking? Authorizing Provider  predniSONE (STERAPRED UNI-PAK 21 TAB) 10 MG (21) TBPK tablet Take by mouth daily. Take 6 tabs by mouth daily  for 2 days, then 5 tabs for 2 days, then 4 tabs for 2 days, then 3 tabs for 2 days, 2 tabs for 2 days, then 1 tab by mouth daily for 2 days 06/10/23  Yes Trevor Iha, FNP  fluticasone (FLONASE) 50 MCG/ACT nasal spray Place 2 sprays into both nostrils daily. 11/27/17   Couture, Cortni S, PA-C    Family History History reviewed. No pertinent family history.  Social History Social History   Tobacco Use   Smoking status: Never   Smokeless tobacco: Never  Vaping Use   Vaping status: Never Used  Substance Use Topics   Alcohol use: No   Drug use: No     Allergies   Patient has no known allergies.   Review of Systems Review of Systems  Musculoskeletal:  Positive for back pain.     Physical Exam Triage Vital Signs ED Triage Vitals  Encounter Vitals Group     BP 06/10/23 1423 (!) 158/92     Systolic BP Percentile --      Diastolic BP Percentile --      Pulse Rate 06/10/23 1423 74     Resp 06/10/23 1423 18     Temp 06/10/23 1423 98.2 F (36.8 C)     Temp Source 06/10/23 1423 Oral     SpO2 06/10/23 1423 96 %     Weight 06/10/23 1425 250 lb (113.4 kg)      Height 06/10/23 1425 5\' 10"  (1.778 m)     Head Circumference --      Peak Flow --      Pain Score 06/10/23 1424 6     Pain Loc --      Pain Education --      Exclude from Growth Chart --    No data found.  Updated Vital Signs BP (!) 158/92   Pulse 74   Temp 98.2 F (36.8 C) (Oral)   Resp 18   Ht 5\' 10"  (1.778 m)   Wt 250 lb (113.4 kg)   SpO2 96%   BMI 35.87 kg/m    Physical Exam Vitals and nursing note reviewed.  Constitutional:      Appearance: Normal appearance. He is normal weight.  HENT:     Head: Normocephalic and atraumatic.     Mouth/Throat:     Mouth: Mucous membranes are moist.     Pharynx: Oropharynx is clear.  Eyes:     Extraocular Movements: Extraocular movements intact.     Conjunctiva/sclera: Conjunctivae normal.  Pupils: Pupils are equal, round, and reactive to light.  Cardiovascular:     Rate and Rhythm: Normal rate and regular rhythm.     Pulses: Normal pulses.     Heart sounds: Normal heart sounds.  Pulmonary:     Effort: Pulmonary effort is normal.     Breath sounds: Normal breath sounds. No wheezing, rhonchi or rales.  Musculoskeletal:        General: Normal range of motion.     Cervical back: Normal range of motion and neck supple.  Skin:    General: Skin is warm and dry.  Neurological:     General: No focal deficit present.     Mental Status: He is alert and oriented to person, place, and time. Mental status is at baseline.  Psychiatric:        Mood and Affect: Mood normal.        Behavior: Behavior normal.        Thought Content: Thought content normal.      UC Treatments / Results  Labs (all labs ordered are listed, but only abnormal results are displayed) Labs Reviewed - No data to display  EKG   Radiology No results found.  Procedures Procedures (including critical care time)  Medications Ordered in UC Medications  methylPREDNISolone sodium succinate (SOLU-MEDROL) 125 mg/2 mL injection 125 mg (125 mg Intramuscular  Given 06/10/23 1501)    Initial Impression / Assessment and Plan / UC Course  I have reviewed the triage vital signs and the nursing notes.  Pertinent labs & imaging results that were available during my care of the patient were reviewed by me and considered in my medical decision making (see chart for details).     MDM: 1.  Sciatica of right side-IM Solu-Medrol 125 mg given once in clinic and prior to discharge; 2.  Strain of lumbar region, initial encounter-Rx'd Sterapred Unipak (tapering from 60 mg to 10 mg over 10 days).  2-day work note provided to patient prior to discharge per request. Advised patient to take medication as directed with food to completion.  Encouraged to increase daily water intake to 64 ounces per day while taking this medication.  Advised if symptoms worsen and/or unresolved please follow-up PCP or here for further evaluation.  Patient discharged home, hemodynamically stable.  Final Clinical Impressions(s) / UC Diagnoses   Final diagnoses:  Sciatica of right side  Strain of lumbar region, initial encounter     Discharge Instructions      Advised patient to take medication as directed with food to completion.  Encouraged to increase daily water intake to 64 ounces per day while taking this medication.  Advised if symptoms worsen and/or unresolved please follow-up PCP or here for further evaluation.     ED Prescriptions     Medication Sig Dispense Auth. Provider   predniSONE (STERAPRED UNI-PAK 21 TAB) 10 MG (21) TBPK tablet Take by mouth daily. Take 6 tabs by mouth daily  for 2 days, then 5 tabs for 2 days, then 4 tabs for 2 days, then 3 tabs for 2 days, 2 tabs for 2 days, then 1 tab by mouth daily for 2 days 42 tablet Trevor Iha, FNP      PDMP not reviewed this encounter.   Trevor Iha, FNP 06/10/23 1531

## 2023-06-10 NOTE — ED Triage Notes (Signed)
Pt started with right groin pain while walking about 1 week ago. Denies injury.  Pain worse with ambulation and first thing in the AM.  Nothing OTC has helped.  Pain radiates from right groin around to right lower back.  Denies hematuria.  Hx lower back problems but reports this feels different. No fevers.  No testicular pain.

## 2023-07-27 ENCOUNTER — Encounter (HOSPITAL_COMMUNITY): Payer: Self-pay

## 2023-07-27 ENCOUNTER — Emergency Department (HOSPITAL_COMMUNITY)
Admission: EM | Admit: 2023-07-27 | Discharge: 2023-07-27 | Disposition: A | Discharge status: Home / Self Care | Payer: 59 | Attending: Emergency Medicine | Admitting: Emergency Medicine

## 2023-07-27 ENCOUNTER — Other Ambulatory Visit: Payer: Self-pay

## 2023-07-27 DIAGNOSIS — U071 COVID-19: Secondary | ICD-10-CM | POA: Insufficient documentation

## 2023-07-27 DIAGNOSIS — E119 Type 2 diabetes mellitus without complications: Secondary | ICD-10-CM | POA: Insufficient documentation

## 2023-07-27 DIAGNOSIS — Z7984 Long term (current) use of oral hypoglycemic drugs: Secondary | ICD-10-CM | POA: Insufficient documentation

## 2023-07-27 LAB — RESP PANEL BY RT-PCR (RSV, FLU A&B, COVID)  RVPGX2
Influenza A by PCR: NEGATIVE
Influenza B by PCR: NEGATIVE
Resp Syncytial Virus by PCR: NEGATIVE
SARS Coronavirus 2 by RT PCR: POSITIVE — AB

## 2023-07-27 LAB — CBG MONITORING, ED: Glucose-Capillary: 162 mg/dL — ABNORMAL HIGH (ref 70–99)

## 2023-07-27 MED ORDER — ACETAMINOPHEN 500 MG PO TABS
1000.0000 mg | ORAL_TABLET | Freq: Once | ORAL | Status: AC
  Administered 2023-07-27: 1000 mg via ORAL
  Filled 2023-07-27: qty 2

## 2023-07-27 MED ORDER — SODIUM CHLORIDE 0.9 % IV BOLUS
1000.0000 mL | Freq: Once | INTRAVENOUS | Status: AC
  Administered 2023-07-27: 1000 mL via INTRAVENOUS

## 2023-07-27 MED ORDER — METFORMIN HCL 500 MG PO TABS: 500.0000 mg | ORAL_TABLET | Freq: Two times a day (BID) | ORAL | 0 refills | Status: AC

## 2023-07-27 NOTE — ED Provider Notes (Signed)
Paul Frey   CSN: [id number] Arrival date & time: 07/27/23  6578     History  Chief Complaint  Patient presents with   Nasal Congestion    Paul Frey is a 42 y.o. male.  Paul Frey is a 42 yo male with past medical history of T2DM who presents today for fever, congestion and malaise for a few days followed by a brief episode of lightheadedness and shortness of breath this morning. Patient states he has had fever, chills, diaphoresis, and flu-like symptoms for 2-3 days. He works at [employer] so is unsure of exposure to sick contact. No one at home is sick.  Patient reports when he went to stand up from bed this morning he briefly felt lightheaded and felt that during the symptoms he felt slightly short of breath, he sat down and the symptoms resolved he has had no recurrence of lightheadedness or shortness of breath.  He denies any chest pain or palpitations.  Patient has taken Zicam and tylenol for symptom management. Patient states he believes Zicam caused his nausea and abdominal pain last night which has since resolved. Patient reports normal BM, headache, occasional cough, and congestion. Patient denies any SOB, chest pain, or coughing up any phlegm.  Patient has not been taking metformin since early this year.  The history is provided by the patient.       Home Medications Prior to Admission medications   Medication Sig Start Date End Date Taking? Authorizing Provider  metFORMIN (GLUCOPHAGE) 500 MG tablet Take 1 tablet (500 mg total) by mouth 2 (two) times daily with a meal. 07/27/23  Yes Dartha Lodge, PA-C  fluticasone (FLONASE) 50 MCG/ACT nasal spray Place 2 sprays into both nostrils daily. 11/27/17   Couture, Cortni S, PA-C  predniSONE (STERAPRED UNI-PAK 21 TAB) 10 MG (21) TBPK tablet Take by mouth daily. Take 6 tabs by mouth daily  for 2 days, then 5 tabs for 2 days, then 4 tabs for 2 days, then 3 tabs for 2 days, 2  tabs for 2 days, then 1 tab by mouth daily for 2 days 06/10/23   Trevor Iha, FNP      Allergies    Patient has no known allergies.    Review of Systems   Review of Systems  Constitutional:  Positive for chills and fever.  HENT:  Positive for congestion. Negative for ear pain and sore throat.   Respiratory:  Negative for cough and shortness of breath.   Cardiovascular:  Negative for chest pain and palpitations.  Gastrointestinal:  Negative for abdominal pain, diarrhea, nausea and vomiting.  Musculoskeletal:  Positive for myalgias. Negative for neck pain and neck stiffness.  Neurological:  Positive for dizziness. Negative for headaches.    Physical Exam Updated Vital Signs BP 119/69   Pulse 74   Temp 98.6 F (37 C)   Resp 12   Ht 5\' 10"  (1.778 m)   Wt 113.4 kg   SpO2 94%   BMI 35.87 kg/m  Physical Exam Vitals and nursing Frey reviewed.  Constitutional:      General: He is not in acute distress.    Appearance: Normal appearance. He is well-developed. He is obese. He is not ill-appearing or diaphoretic.  HENT:     Head: Normocephalic and atraumatic.     Nose: Congestion and rhinorrhea present.     Mouth/Throat:     Mouth: Mucous membranes are moist.  Pharynx: Oropharynx is clear. No oropharyngeal exudate or posterior oropharyngeal erythema.  Eyes:     General:        Right eye: No discharge.        Left eye: No discharge.  Neck:     Comments: No rigidity Cardiovascular:     Rate and Rhythm: Normal rate and regular rhythm.     Heart sounds: Normal heart sounds. No murmur heard.    No friction rub. No gallop.  Pulmonary:     Effort: Pulmonary effort is normal. No respiratory distress.     Breath sounds: Normal breath sounds.     Comments: Respirations equal and unlabored, patient able to speak in full sentences, lungs clear to auscultation bilaterally  Abdominal:     General: Bowel sounds are normal. There is no distension.     Palpations: Abdomen is soft.  There is no mass.     Tenderness: There is no abdominal tenderness. There is no guarding.     Comments: Abdomen soft, nondistended, nontender to palpation in all quadrants without guarding or peritoneal signs  Musculoskeletal:        General: No deformity.     Cervical back: Neck supple. No rigidity.  Lymphadenopathy:     Cervical: No cervical adenopathy.  Skin:    General: Skin is warm and dry.     Capillary Refill: Capillary refill takes less than 2 seconds.  Neurological:     Mental Status: He is alert and oriented to person, place, and time.     Comments: Speech is clear, able to follow commands Moves extremities without ataxia, coordination intact  Psychiatric:        Mood and Affect: Mood normal.        Behavior: Behavior normal.     ED Results / Procedures / Treatments   Labs (all labs ordered are listed, but only abnormal results are displayed) Labs Reviewed  RESP PANEL BY RT-PCR (RSV, FLU A&B, COVID)  RVPGX2 - Abnormal; Notable for the following components:      Result Value   SARS Coronavirus 2 by RT PCR POSITIVE (*)    All other components within normal limits  CBG MONITORING, ED - Abnormal; Notable for the following components:   Glucose-Capillary 162 (*)    All other components within normal limits    EKG EKG Interpretation Date/Time:  Thursday July 27 2023 06:51:34 EDT Ventricular Rate:  90 PR Interval:  163 QRS Duration:  109 QT Interval:  337 QTC Calculation: 413 R Axis:   -14  Text Interpretation: Sinus rhythm Consider left atrial enlargement Confirmed by Nicanor Alcon, April ([id number]) on 07/27/2023 6:53:12 AM  Radiology No results found.  Procedures Procedures    Medications Ordered in ED Medications  acetaminophen (TYLENOL) tablet 1,000 mg (1,000 mg Oral Given 07/27/23 0757)  sodium chloride 0.9 % bolus 1,000 mL (0 mLs Intravenous Stopped 07/27/23 0946)    ED Course/ Medical Decision Making/ A&P                                 Medical  Decision Making Risk OTC drugs.   42 y.o. male presents with 2-3 days of fever and congestion. Concerned for possible COVID, flu or other viral respiratory illness. Unknown sick contacts. Patient is unvaccinated. . Fortunately patient is overall well-appearing, febrile but vitals otherwise reassuring.  Patient with no hypoxia or increased work of breathing at rest or with activity.  Given reassuring O2 sats do not feel that chest x-ray is indicated at this time.  Patient history of diabetes he has not been taking medication for this but fortunately blood sugar today is mildly elevated at 162, represcribe patient's metformin and encouraged him to follow-up closely with his primary care doctor regarding his diabetes.  Patient's COVID test is positive today which explains patient's symptoms.  .  Patient with suspected COVID infection today but overall symptoms appear mild and evaluation has been very reassuring today. No criteria for admission at this time. Discussed appropriate quarantine at home as well as continued symptomatic treatment. Encourage patient to purchase pulse ox for monitoring of O2 sats at home and discussed strict return precaution. Provided information for post-COVID care clinic as well. Strict return precautions discussed. Patient expresses understanding and agreement. Discharged home in good condition.          Final Clinical Impression(s) / ED Diagnoses Final diagnoses:  COVID-19 virus infection    Rx / DC Orders ED Discharge Orders          Ordered    metFORMIN (GLUCOPHAGE) 500 MG tablet  2 times daily with meals        07/27/23 1012              Dartha Lodge, [place] 07/27/23 1021    Melene Plan, DO 07/27/23 1517

## 2023-07-27 NOTE — Discharge Instructions (Signed)
You have tested positive for COVID-19 virus.  Please continue to quarantine at home and monitor your symptoms closely. Antibiotics are not helpful in treating viral infection, the virus should run its course in about 5-7 days. Please make sure you are drinking plenty of fluids. You can treat your symptoms supportively with tylenol 1000 mg every 6 hrs for fevers and pains, and over the counter cough syrups and throat lozenges to help with cough. If your symptoms are not improving please follow up with you Primary doctor.   I recommend that you purchase a home pulse ox to help better monitor your oxygen at home, if you start to have increased work of breathing or shortness of breath or your oxygen drops below 90% please immediately return to the hospital for reevaluation.  If you develop persistent fevers, shortness of breath or difficulty breathing, chest pain, severe headache and neck pain, persistent nausea and vomiting or other new or concerning symptoms return to the Emergency department.

## 2023-07-27 NOTE — ED Triage Notes (Addendum)
Patient BIB [facility] from home with complaint of chills x couple days, abdominal pain starting today at 0500 no vomiting no diarrhea, dizziness with position changes.   Patient denies SOB, denies CP.   A & O x 4.  Patient reports that abdominal pain & dizziness has resolved

## 2023-08-19 ENCOUNTER — Ambulatory Visit
Admission: EM | Admit: 2023-08-19 | Discharge: 2023-08-19 | Disposition: A | Discharge status: Home / Self Care | Payer: No Typology Code available for payment source | Attending: Internal Medicine | Admitting: Internal Medicine

## 2023-08-19 ENCOUNTER — Encounter: Payer: Self-pay | Admitting: Emergency Medicine

## 2023-08-19 DIAGNOSIS — R079 Chest pain, unspecified: Secondary | ICD-10-CM

## 2023-08-19 DIAGNOSIS — R202 Paresthesia of skin: Secondary | ICD-10-CM | POA: Diagnosis not present

## 2023-08-19 DIAGNOSIS — R002 Palpitations: Secondary | ICD-10-CM | POA: Diagnosis not present

## 2023-08-19 LAB — POCT FASTING CBG KUC MANUAL ENTRY: POCT Glucose (KUC): 236 mg/dL — AB (ref 70–99)

## 2023-08-19 NOTE — ED Provider Notes (Signed)
[name]    CSN: [id number] Arrival date & time: 08/19/23  1100      History   Chief Complaint Chief Complaint  Patient presents with   Chest Pain    HPI Paul Frey is a 42 y.o. male.   Patient presents today with several different chief complaints.  Reports that over the past 2 to 3 days, he has been having some right-sided chest pain and tightness, tingling in bilateral hands and feet, palpitations, shortness of breath.  Patient reports sensation does radiate down his right arm and he is not reporting any left-sided chest pain.  Denies any pertinent cardiac or pulmonary history.  He does report that he had COVID a few weeks prior.  He states that when he gets palpitations, it causes him to cough but otherwise denies any persistent coughing or COVID symptoms.  Denies any recent fevers.  Patient does have a pertinent medical history of diabetes where he takes metformin.  Last checked his blood sugar yesterday and it was in the 200s as patient reports is baseline for him. Denies any injury to the chest. Reports movement of the right arm does make pain worse at times. Denies current headache, blurred vision, dizziness, nausea, vomiting. He does report that he has been having intermittent headaches since symptoms started.    Chest Pain   Past Medical History:  Diagnosis Date   Diabetes mellitus without complication (HCC)    Hypertension    Keloid of skin    Prediabetes     There are no problems to display for this patient.   Past Surgical History:  Procedure Laterality Date   KELOID EXCISION         Home Medications    Prior to Admission medications   Medication Sig Start Date End Date Taking? Authorizing Provider  fluticasone (FLONASE) 50 MCG/ACT nasal spray Place 2 sprays into both nostrils daily. 11/27/17  Yes Couture, Cortni S, PA-C  metFORMIN (GLUCOPHAGE) 500 MG tablet Take 1 tablet (500 mg total) by mouth 2 (two) times daily with a meal. 07/27/23   Yes Dartha Lodge, PA-C  predniSONE (STERAPRED UNI-PAK 21 TAB) 10 MG (21) TBPK tablet Take by mouth daily. Take 6 tabs by mouth daily  for 2 days, then 5 tabs for 2 days, then 4 tabs for 2 days, then 3 tabs for 2 days, 2 tabs for 2 days, then 1 tab by mouth daily for 2 days 06/10/23   Trevor Iha, FNP    Family History History reviewed. No pertinent family history.  Social History Social History   Tobacco Use   Smoking status: Never   Smokeless tobacco: Never  Vaping Use   Vaping status: Never Used  Substance Use Topics   Alcohol use: No   Drug use: No     Allergies   Patient has no known allergies.   Review of Systems Review of Systems Per HPI  Physical Exam Triage Vital Signs ED Triage Vitals  Encounter Vitals Group     BP 08/19/23 1120 (!) 153/105     Systolic BP Percentile --      Diastolic BP Percentile --      Pulse Rate 08/19/23 1120 75     Resp 08/19/23 1120 20     Temp 08/19/23 1120 98.4 F (36.9 C)     Temp Source 08/19/23 1120 Oral     SpO2 08/19/23 1120 95 %     Weight 08/19/23 1122 250 lb (113.4 kg)  Height 08/19/23 1122 5\' 9"  (1.753 m)     Head Circumference --      Peak Flow --      Pain Score 08/19/23 1122 5     Pain Loc --      Pain Education --      Exclude from Growth Chart --    No data found.  Updated Vital Signs BP (!) 153/105 (BP Location: Left Arm)   Pulse 75   Temp 98.4 F (36.9 C) (Oral)   Resp 20   Ht 5\' 9"  (1.753 m)   Wt 250 lb (113.4 kg)   SpO2 95%   BMI 36.92 kg/m   Visual Acuity Right Eye Distance:   Left Eye Distance:   Bilateral Distance:    Right Eye Near:   Left Eye Near:    Bilateral Near:     Physical Exam Constitutional:      General: He is not in acute distress.    Appearance: Normal appearance. He is not toxic-appearing or diaphoretic.  HENT:     Head: Normocephalic and atraumatic.  Eyes:     Extraocular Movements: Extraocular movements intact.     Conjunctiva/sclera: Conjunctivae normal.      Pupils: Pupils are equal, round, and reactive to light.  Cardiovascular:     Rate and Rhythm: Normal rate and regular rhythm.     Pulses: Normal pulses.     Heart sounds: Normal heart sounds.  Pulmonary:     Effort: Pulmonary effort is normal. No respiratory distress.     Breath sounds: Normal breath sounds.  Chest:     Chest wall: No tenderness.  Neurological:     General: No focal deficit present.     Mental Status: He is alert and oriented to person, place, and time. Mental status is at baseline.     Cranial Nerves: Cranial nerves 2-12 are intact.     Sensory: Sensation is intact.     Motor: Motor function is intact.     Coordination: Coordination is intact.     Gait: Gait is intact.  Psychiatric:        Mood and Affect: Mood normal.        Behavior: Behavior normal.        Thought Content: Thought content normal.        Judgment: Judgment normal.      UC Treatments / Results  Labs (all labs ordered are listed, but only abnormal results are displayed) Labs Reviewed  POCT FASTING CBG [facility] MANUAL ENTRY - Abnormal; Notable for the following components:      Result Value   POCT Glucose ([facility]) 236 (*)    All other components within normal limits    EKG   Radiology No results found.  Procedures Procedures (including critical care time)  Medications Ordered in UC Medications - No data to display  Initial Impression / Assessment and Plan / UC Course  I have reviewed the triage vital signs and the nursing notes.  Pertinent labs & imaging results that were available during my care of the patient were reviewed by me and considered in my medical decision making (see chart for details).     Discussed with patient limited resources here in urgent care for evaluation of chest pain.  EKG was completed that was normal sinus rhythm and blood sugar completed that was 236.  I do have a low suspicion for cardiac etiology given patient is having right-sided chest pain,  although he is having  palpitations, headaches, paresthesias so more adequate and extensive evaluation is most likely necessary and reasonable which cannot be accurately provided here in urgent care.  Therefore, recommended to the patient that he go to the emergency department for further evaluation and management.  Although, did offer patient limited workup here in urgent care for evaluation of symptoms as chest pain could be muscular in etiology.  Patient wished to proceed to the emergency department via self transport.  Patient left to go to the emergency department for further evaluation and management. Final Clinical Impressions(s) / UC Diagnoses   Final diagnoses:  None   Discharge Instructions   None    ED Prescriptions   None    PDMP not reviewed this encounter.   Gustavus Bryant, [place] 08/19/23 1154

## 2023-08-19 NOTE — ED Notes (Signed)
Patient is being discharged from the Urgent Care and sent to the Emergency Department via private vehicle . Per [facility], patient is in need of higher level of care due to further evaluation. Patient is aware and verbalizes understanding of plan of care.  Vitals:   08/19/23 1120  BP: (!) 153/105  Pulse: 75  Resp: 20  Temp: 98.4 F (36.9 C)  SpO2: 95%

## 2023-08-19 NOTE — ED Triage Notes (Signed)
Patient c/o right sided chest tightness that started last night while sleeping.  Tingling in hands and feet.  While sleeping, he felt like he was gasping for air.  Patient did test positive for COVID x 3 weeks.  Denies any OTC cough meds.

## 2023-09-04 ENCOUNTER — Emergency Department (HOSPITAL_COMMUNITY): Payer: No Typology Code available for payment source

## 2023-09-04 ENCOUNTER — Emergency Department (HOSPITAL_COMMUNITY)
Admission: EM | Admit: 2023-09-04 | Discharge: 2023-09-04 | Disposition: A | Discharge status: Home / Self Care | Payer: No Typology Code available for payment source | Attending: Emergency Medicine | Admitting: Emergency Medicine

## 2023-09-04 ENCOUNTER — Encounter (HOSPITAL_COMMUNITY): Payer: Self-pay

## 2023-09-04 ENCOUNTER — Other Ambulatory Visit: Payer: Self-pay

## 2023-09-04 DIAGNOSIS — R55 Syncope and collapse: Secondary | ICD-10-CM | POA: Insufficient documentation

## 2023-09-04 DIAGNOSIS — K5732 Diverticulitis of large intestine without perforation or abscess without bleeding: Secondary | ICD-10-CM | POA: Insufficient documentation

## 2023-09-04 DIAGNOSIS — R1031 Right lower quadrant pain: Secondary | ICD-10-CM

## 2023-09-04 DIAGNOSIS — N5089 Other specified disorders of the male genital organs: Secondary | ICD-10-CM | POA: Diagnosis not present

## 2023-09-04 DIAGNOSIS — K5792 Diverticulitis of intestine, part unspecified, without perforation or abscess without bleeding: Secondary | ICD-10-CM

## 2023-09-04 LAB — COMPREHENSIVE METABOLIC PANEL
ALT: 33 U/L (ref 0–44)
AST: 23 U/L (ref 15–41)
Albumin: 4.3 g/dL (ref 3.5–5.0)
Alkaline Phosphatase: 58 U/L (ref 38–126)
Anion gap: 16 — ABNORMAL HIGH (ref 5–15)
BUN: 6 mg/dL (ref 6–20)
CO2: 25 mmol/L (ref 22–32)
Calcium: 9.4 mg/dL (ref 8.9–10.3)
Chloride: 97 mmol/L — ABNORMAL LOW (ref 98–111)
Creatinine, Ser: 0.99 mg/dL (ref 0.61–1.24)
GFR, Estimated: >60 mL/min (ref >60)
Glucose, Bld: 115 mg/dL — ABNORMAL HIGH (ref 70–99)
Potassium: 3.7 mmol/L (ref 3.5–5.1)
Sodium: 138 mmol/L (ref 135–145)
Total Bilirubin: 1.4 mg/dL — ABNORMAL HIGH (ref <1.2)
Total Protein: 7.8 g/dL (ref 6.5–8.1)

## 2023-09-04 LAB — CBC WITH DIFFERENTIAL/PLATELET
Abs Immature Granulocytes: 0.11 10*3/uL — ABNORMAL HIGH (ref 0.00–0.07)
Basophils Absolute: 0.1 10*3/uL (ref 0.0–0.1)
Basophils Relative: 0 %
Eosinophils Absolute: 0 10*3/uL (ref 0.0–0.5)
Eosinophils Relative: 0 %
HCT: 47.8 % (ref 39.0–52.0)
Hemoglobin: 16.1 g/dL (ref 13.0–17.0)
Immature Granulocytes: 1 %
Lymphocytes Relative: 9 %
Lymphs Abs: 1.7 10*3/uL (ref 0.7–4.0)
MCH: 28.6 pg (ref 26.0–34.0)
MCHC: 33.7 g/dL (ref 30.0–36.0)
MCV: 84.9 fL (ref 80.0–100.0)
Monocytes Absolute: 1.3 10*3/uL — ABNORMAL HIGH (ref 0.1–1.0)
Monocytes Relative: 7 %
Neutro Abs: 16 10*3/uL — ABNORMAL HIGH (ref 1.7–7.7)
Neutrophils Relative %: 83 %
Platelets: 264 10*3/uL (ref 150–400)
RBC: 5.63 MIL/uL (ref 4.22–5.81)
RDW: 12.1 % (ref 11.5–15.5)
WBC: 19.2 10*3/uL — ABNORMAL HIGH (ref 4.0–10.5)
nRBC: 0 % (ref 0.0–0.2)

## 2023-09-04 LAB — URINALYSIS, ROUTINE W REFLEX MICROSCOPIC
Bilirubin Urine: NEGATIVE
Glucose, UA: NEGATIVE mg/dL
Hgb urine dipstick: NEGATIVE
Ketones, ur: NEGATIVE mg/dL
Leukocytes,Ua: NEGATIVE
Nitrite: NEGATIVE
Protein, ur: NEGATIVE mg/dL
Specific Gravity, Urine: 1.013 (ref 1.005–1.030)
pH: 6 (ref 5.0–8.0)

## 2023-09-04 LAB — LIPASE, BLOOD: Lipase: 29 U/L (ref 11–51)

## 2023-09-04 MED ORDER — SODIUM CHLORIDE 0.9 % IV BOLUS
500.0000 mL | Freq: Once | INTRAVENOUS | Status: AC
  Administered 2023-09-04: 500 mL via INTRAVENOUS

## 2023-09-04 MED ORDER — AMOXICILLIN-POT CLAVULANATE 875-125 MG PO TABS: 1.0000 | ORAL_TABLET | Freq: Two times a day (BID) | ORAL | 0 refills | Status: AC

## 2023-09-04 MED ORDER — IOHEXOL 350 MG/ML SOLN
75.0000 mL | Freq: Once | INTRAVENOUS | Status: AC | PRN
  Administered 2023-09-04: 75 mL via INTRAVENOUS

## 2023-09-04 MED ORDER — ACETAMINOPHEN 500 MG PO TABS
1000.0000 mg | ORAL_TABLET | Freq: Once | ORAL | Status: AC
  Administered 2023-09-04: 1000 mg via ORAL
  Filled 2023-09-04: qty 2

## 2023-09-04 MED ORDER — MORPHINE SULFATE (PF) 4 MG/ML IV SOLN
4.0000 mg | Freq: Once | INTRAVENOUS | Status: AC
  Administered 2023-09-04: 4 mg via INTRAVENOUS
  Filled 2023-09-04 (×2): qty 1

## 2023-09-04 NOTE — ED Triage Notes (Signed)
Pt reports that his last bm was 3 or 4 days ago

## 2023-09-04 NOTE — ED Provider Notes (Signed)
[place] EMERGENCY DEPARTMENT AT [facility] Provider Note   CSN: [id number] Arrival date & time: 09/04/23  1505     History  Chief Complaint  Patient presents with   Testicle Pain   Abdominal Pain    Paul Frey is a 42 y.o. male.  Patient presents with right lower quadrant pain, nausea, decreased appetite and radiation of the right testicle for 3 days.  Intermittent constipation symptoms as well.  No abdominal surgery history.  History of diabetes and hypertension.  Patient feels generally unwell.  The history is provided by the patient.  Testicle Pain This is a new problem. Associated symptoms include abdominal pain. Pertinent negatives include no chest pain, no headaches and no shortness of breath.  Abdominal Pain Associated symptoms: nausea and vomiting   Associated symptoms: no chest pain, no chills, no dysuria, no fever and no shortness of breath        Home Medications Prior to Admission medications   Medication Sig Start Date End Date Taking? Authorizing Provider  amoxicillin-clavulanate (AUGMENTIN) 875-125 MG tablet Take 1 tablet by mouth every 12 (twelve) hours. 09/04/23  Yes Blane Ohara, MD  fluticasone (FLONASE) 50 MCG/ACT nasal spray Place 2 sprays into both nostrils daily. 11/27/17   Couture, Cortni S, PA-C  metFORMIN (GLUCOPHAGE) 500 MG tablet Take 1 tablet (500 mg total) by mouth 2 (two) times daily with a meal. 07/27/23   Dartha Lodge, PA-C  predniSONE (STERAPRED UNI-PAK 21 TAB) 10 MG (21) TBPK tablet Take by mouth daily. Take 6 tabs by mouth daily  for 2 days, then 5 tabs for 2 days, then 4 tabs for 2 days, then 3 tabs for 2 days, 2 tabs for 2 days, then 1 tab by mouth daily for 2 days 06/10/23   Trevor Iha, FNP      Allergies    Patient has no known allergies.    Review of Systems   Review of Systems  Constitutional:  Negative for chills and fever.  HENT:  Negative for congestion.   Eyes:  Negative for visual disturbance.   Respiratory:  Negative for shortness of breath.   Cardiovascular:  Negative for chest pain.  Gastrointestinal:  Positive for abdominal pain, nausea and vomiting.  Genitourinary:  Positive for testicular pain. Negative for dysuria and flank pain.  Musculoskeletal:  Negative for back pain, neck pain and neck stiffness.  Skin:  Negative for rash.  Neurological:  Positive for light-headedness. Negative for headaches.    Physical Exam Updated Vital Signs BP (!) 141/91   Pulse 96   Temp 99.5 F (37.5 C) (Oral)   Resp 19   Ht 5\' 10"  (1.778 m)   Wt 113.4 kg   SpO2 93%   BMI 35.87 kg/m  Physical Exam Vitals and nursing note reviewed.  Constitutional:      General: He is not in acute distress.    Appearance: He is well-developed.  HENT:     Head: Normocephalic and atraumatic.     Mouth/Throat:     Mouth: Mucous membranes are moist.  Eyes:     General:        Right eye: No discharge.        Left eye: No discharge.     Conjunctiva/sclera: Conjunctivae normal.  Neck:     Trachea: No tracheal deviation.  Cardiovascular:     Rate and Rhythm: Normal rate and regular rhythm.     Heart sounds: No murmur heard. Pulmonary:  Effort: Pulmonary effort is normal.     Breath sounds: Normal breath sounds.  Abdominal:     General: There is no distension.     Palpations: Abdomen is soft.     Tenderness: There is abdominal tenderness in the right lower quadrant. There is no guarding.  Genitourinary:    Testes: Normal.     Comments: Minimal right inguinal tenderness no rash, no focal testicular tenderness or edema. Musculoskeletal:     Cervical back: Normal range of motion and neck supple. No rigidity.  Skin:    General: Skin is warm.     Capillary Refill: Capillary refill takes less than 2 seconds.     Findings: No rash.  Neurological:     General: No focal deficit present.     Mental Status: He is alert.     Cranial Nerves: No cranial nerve deficit.  Psychiatric:        Mood and  Affect: Mood normal.     ED Results / Procedures / Treatments   Labs (all labs ordered are listed, but only abnormal results are displayed) Labs Reviewed  COMPREHENSIVE METABOLIC PANEL - Abnormal; Notable for the following components:      Result Value   Chloride 97 (*)    Glucose, Bld 115 (*)    Total Bilirubin 1.4 (*)    Anion gap 16 (*)    All other components within normal limits  CBC WITH DIFFERENTIAL/PLATELET - Abnormal; Notable for the following components:   WBC 19.2 (*)    Neutro Abs 16.0 (*)    Monocytes Absolute 1.3 (*)    Abs Immature Granulocytes 0.11 (*)    All other components within normal limits  LIPASE, BLOOD  URINALYSIS, ROUTINE W REFLEX MICROSCOPIC    EKG None  Radiology No results found.  Procedures Procedures    Medications Ordered in ED Medications  morphine (PF) 4 MG/ML injection 4 mg (4 mg Intravenous Given 09/04/23 1813)  sodium chloride 0.9 % bolus 500 mL (0 mLs Intravenous Stopped 09/04/23 1937)  acetaminophen (TYLENOL) tablet 1,000 mg (1,000 mg Oral Given 09/04/23 1812)  iohexol (OMNIPAQUE) 350 MG/ML injection 75 mL (75 mLs Intravenous Contrast Given 09/04/23 1947)    ED Course/ Medical Decision Making/ A&P                                 Medical Decision Making Amount and/or Complexity of Data Reviewed Radiology: ordered. ECG/medicine tests: ordered.  Risk OTC drugs. Prescription drug management.   Patient presents with significant right lower quadrant tenderness and radiation of the groin differential includes appendicitis, kidney stone, hernia, other.  Patient diaphoretic and had syncope in the waiting room with pain.  Patient does have [place] blood pressure history, aneurysm on differential as well however patient is fairly young for this.  Plan for blood work, IV fluids, CT scan for further delineation.  Ultrasound of testicles was ordered on arrival.  Ultrasound results of testicles reviewed no torsion, calcifications, patient  will need general follow-up with primary doctor and self-checks for any masses.  CT scan results independently reviewed showing acute diverticulitis without abscess or perforation.  Normal appendix.  Patient's vitals and clinical improvement of pain on reassessment.  Patient comfortable with picking up prescription for antibiotics.  Outpatient follow-up discussed.  Blood work reassuring           Final Clinical Impression(s) / ED Diagnoses Final diagnoses:  Right lower quadrant  abdominal pain  Vasovagal near syncope  Testicular calcification  Acute diverticulitis    Rx / DC Orders ED Discharge Orders          Ordered    amoxicillin-clavulanate (AUGMENTIN) 875-125 MG tablet  Every 12 hours        09/04/23 2113              Blane Ohara, MD 09/11/23 [phone]

## 2023-09-04 NOTE — ED Notes (Signed)
Went to ultra sound

## 2023-09-04 NOTE — ED Notes (Signed)
Pt in NAD at d/c from ED. A&O. Ambulatory. Respirations even & unlabored. Skin warm & dry. Pt verbalized understanding of d/c teaching including follow up care, medications and reasons to return to the ED. No needs or questions expressed at d/c.

## 2023-09-04 NOTE — Discharge Instructions (Addendum)
Do monthly testicular check to make sure no mass palpated.  Follow-up with your primary doctor. Take antibiotics as directed for 1 week. Use Tylenol 1000 mg every 4 hours and ibuprofen 600 mg every 6 as needed for pain or fever. Soft diet for 3 or 4 days.

## 2023-09-04 NOTE — ED Triage Notes (Signed)
Pt reports abdominal pain and testicular pain starting yesterday. Pt reports that both started at the same time. Pt reports that when he stands up he gets some relief from the testicular pain but abdominal pain remains constant.

## 2023-09-04 NOTE — ED Provider Triage Note (Signed)
Emergency Medicine Provider Triage Evaluation Note  Paul Frey , a 42 y.o. male  was evaluated in triage.  Pt complains of abdominal pain.  Review of Systems  Positive: Constipation, lower right quadrant abdominal pain, right testicular pain, nausea, decreased appetite Negative: Diarrhea, vomiting, fevers, chills  Physical Exam  BP (!) 157/108   Pulse (!) 119   Temp 98.5 F (36.9 C) (Oral)   Resp 18   Ht 5\' 10"  (1.778 m)   Wt 113.4 kg   SpO2 99%   BMI 35.87 kg/m  Gen:   Awake, appears in pain Resp:  Normal effort  MSK:   Moves extremities without difficulty  Other:  Tenderness to palpation in right lower and left lower quadrant of the abdomen  Medical Decision Making  Medically screening exam initiated at 3:33 PM.  Appropriate orders placed.  Paul Frey was informed that the remainder of the evaluation will be completed by another provider, this initial triage assessment does not replace that evaluation, and the importance of remaining in the ED until their evaluation is complete.  Patient reports to emergency room with 3 or 4 days of constipation, started experiencing abdominal pain 2 days ago worse in the right lower quadrant.  Patient reports he has had referred pain to his right testicle.  Patient has decreased appetite, nausea.  Patient feels that testicular pain is resolved upon standing worse when sitting down.  Patient has not noticed any discharge, swelling.  Past medical history of diabetes and hypertension.  Denies any recent abdominal surgeries.  Still has appendix still has gallbladder.   Smitty Knudsen, PA-C 09/04/23 1535
